# Patient Record
Sex: Female | Born: 1960
Health system: Southern US, Community
[De-identification: ages and names within clinical notes are randomized; demographics above are authoritative.]

## PROBLEM LIST (undated history)

## (undated) DIAGNOSIS — L4050 Arthropathic psoriasis, unspecified: Secondary | ICD-10-CM

## (undated) DIAGNOSIS — L80 Vitiligo: Secondary | ICD-10-CM

## (undated) DIAGNOSIS — M069 Rheumatoid arthritis, unspecified: Secondary | ICD-10-CM

## (undated) DIAGNOSIS — N939 Abnormal uterine and vaginal bleeding, unspecified: Secondary | ICD-10-CM

## (undated) DIAGNOSIS — D219 Benign neoplasm of connective and other soft tissue, unspecified: Secondary | ICD-10-CM

## (undated) HISTORY — DX: Arthropathic psoriasis, unspecified: L40.50

## (undated) HISTORY — PX: NO PAST SURGERIES: SHX2092

## (undated) HISTORY — DX: Abnormal uterine and vaginal bleeding, unspecified: N93.9

## (undated) HISTORY — DX: Benign neoplasm of connective and other soft tissue, unspecified: D21.9

---

## 2012-05-22 ENCOUNTER — Ambulatory Visit (INDEPENDENT_AMBULATORY_CARE_PROVIDER_SITE_OTHER): Payer: Managed Care, Other (non HMO) | Admitting: Emergency Medicine

## 2012-05-22 VITALS — BP 120/77 | HR 72 | Temp 98.3°F | Resp 17 | Ht 68.5 in | Wt 175.0 lb

## 2012-05-22 DIAGNOSIS — B86 Scabies: Secondary | ICD-10-CM

## 2012-05-22 MED ORDER — IVERMECTIN 3 MG PO TABS: ORAL_TABLET | ORAL | Status: DC

## 2012-05-22 NOTE — Progress Notes (Signed)
  Subjective:    Patient ID: Brittney Gallegos, female    DOB: 31-Oct-1960, 51 y.o.   MRN: [id number]  HPI patient's ex-husband was in jail when he contracted scabies. This was in April of this year. Patient has been treated with permethrin topical as well as ivermectin. She continues to have intermittent outbreaks of rash or Marilee and bothering her abdomen and hands. She has mixed her own permethrin from the vet supply store.    Review of Systems     Objective:   Physical Exam bite-like areas across lower abdomen and isolated areas on the hand.        Assessment & Plan:  These areas could definitely be scabietic. We'll treat with ivermectin 18 mg repeat in 2 weeks if necessary.

## 2012-05-22 NOTE — Patient Instructions (Signed)
Scabies Scabies are small bugs (mites) that burrow under the skin and cause red bumps and severe itching. These bugs can only be seen with a microscope. Scabies are highly contagious. They can spread easily from person to person by direct contact. They are also spread through sharing clothing or linens that have the scabies mites living in them. It is not unusual for an entire family to become infected through shared towels, clothing, or bedding.  HOME CARE INSTRUCTIONS   Your caregiver may prescribe a cream or lotion to kill the mites. If cream is prescribed, massage the cream into the entire body from the neck to the bottom of both feet. Also massage the cream into the scalp and face if your child is less than 1 year old. Avoid the eyes and mouth. Do not wash your hands after application.  Leave the cream on for 8 to 12 hours. Your child should bathe or shower after the 8 to 12 hour application period. Sometimes it is helpful to apply the cream to your child right before bedtime.  One treatment is usually effective and will eliminate approximately 95% of infestations. For severe cases, your caregiver may decide to repeat the treatment in 1 week. Everyone in your household should be treated with one application of the cream.  New rashes or burrows should not appear within 24 to 48 hours after successful treatment. However, the itching and rash may last for 2 to 4 weeks after successful treatment. Your caregiver may prescribe a medicine to help with the itching or to help the rash go away more quickly.  Scabies can live on clothing or linens for up to 3 days. All of your child's recently used clothing, towels, stuffed toys, and bed linens should be washed in hot water and then dried in a dryer for at least 20 minutes on high heat. Items that cannot be washed should be enclosed in a plastic bag for at least 3 days.  To help relieve itching, bathe your child in a cool bath or apply cool washcloths to the  affected areas.  Your child may return to school after treatment with the prescribed cream. SEEK MEDICAL CARE IF:   The itching persists longer than 4 weeks after treatment.  The rash spreads or becomes infected. Signs of infection include red blisters or yellow-tan crust. Document Released: 07/15/2005 Document Revised: 10/07/2011 Document Reviewed: 11/23/2008 ExitCare Patient Information 2013 [employer].  

## 2012-06-22 ENCOUNTER — Other Ambulatory Visit: Payer: Self-pay | Admitting: Emergency Medicine

## 2012-06-22 MED ORDER — PERMETHRIN 0.25 % LIQD: 1.0000 "application " | Freq: Once | Status: DC

## 2012-06-22 NOTE — Telephone Encounter (Signed)
i sent a refill for both

## 2012-11-26 ENCOUNTER — Encounter: Payer: Self-pay | Admitting: Family Medicine

## 2012-11-26 ENCOUNTER — Ambulatory Visit (INDEPENDENT_AMBULATORY_CARE_PROVIDER_SITE_OTHER): Payer: PRIVATE HEALTH INSURANCE | Admitting: Family Medicine

## 2012-11-26 VITALS — BP 130/80 | HR 72 | Temp 97.9°F | Resp 12 | Ht 68.0 in | Wt 173.0 lb

## 2012-11-26 DIAGNOSIS — Z780 Asymptomatic menopausal state: Secondary | ICD-10-CM

## 2012-11-26 DIAGNOSIS — L4050 Arthropathic psoriasis, unspecified: Secondary | ICD-10-CM

## 2012-11-26 DIAGNOSIS — Z86018 Personal history of other benign neoplasm: Secondary | ICD-10-CM | POA: Insufficient documentation

## 2012-11-26 DIAGNOSIS — Z8742 Personal history of other diseases of the female genital tract: Secondary | ICD-10-CM

## 2012-11-26 NOTE — Progress Notes (Signed)
  Subjective:    Patient ID: Brittney Gallegos, female    DOB: [birth year], 52 y.o.   MRN: [id number]  HPI Patient seen to establish care. She has history of psoriatic arthritis followed by rheumatologist at [facility]. She takes plaquenil 200 mg once daily and is getting regular eye care. History of reported uterine fibroids. No menstrual period for about 13 months. No prior surgeries. History of some recurrent scabies and ongoing issues with that.  Family history is reviewed and as indicated below. Her mom is also had history of temporal arteritis.  Patient is divorced. No children. Nonsmoker. Only occasional alcohol use.   Review of Systems  Constitutional: Negative for fatigue.  Eyes: Negative for visual disturbance.  Respiratory: Negative for cough, chest tightness, shortness of breath and wheezing.   Cardiovascular: Negative for chest pain, palpitations and leg swelling.  Skin: Positive for rash.  Neurological: Negative for dizziness, seizures, syncope, weakness, light-headedness and headaches.       Objective:   Physical Exam  Constitutional: She appears well-developed and well-nourished.  Neck: Neck supple. No thyromegaly present.  Cardiovascular: Normal rate and regular rhythm.   No murmur heard. Pulmonary/Chest: Effort normal and breath sounds normal. No respiratory distress. She has no wheezes. She has no rales.  Musculoskeletal: She exhibits no edema.          Assessment & Plan:  #1 history of psoriatic arthritis followed by rheumatology #2 postmenopausal. Minimal hot flushes #3 history of uterine fibroids which are asymptomatic #4 health maintenance. She is encouraged to schedule complete physical but she has very high deductible and is reluctant at this time. She's never had screening colonoscopy

## 2012-11-26 NOTE — Patient Instructions (Addendum)
Consider complete physical at some point later this year. 

## 2013-07-15 ENCOUNTER — Ambulatory Visit (INDEPENDENT_AMBULATORY_CARE_PROVIDER_SITE_OTHER): Payer: PRIVATE HEALTH INSURANCE | Admitting: Family Medicine

## 2013-07-15 ENCOUNTER — Encounter: Payer: Self-pay | Admitting: Family Medicine

## 2013-07-15 VITALS — BP 116/72 | HR 75 | Temp 97.7°F | Wt 178.0 lb

## 2013-07-15 DIAGNOSIS — J019 Acute sinusitis, unspecified: Secondary | ICD-10-CM

## 2013-07-15 MED ORDER — AMOXICILLIN-POT CLAVULANATE 875-125 MG PO TABS: 1.0000 | ORAL_TABLET | Freq: Two times a day (BID) | ORAL | Status: DC

## 2013-07-15 NOTE — Patient Instructions (Signed)

## 2013-07-15 NOTE — Progress Notes (Signed)
Pre visit review using our clinic review tool, if applicable. No additional management support is needed unless otherwise documented below in the visit note. 

## 2013-07-15 NOTE — Progress Notes (Signed)
   Subjective:    Patient ID: Brittney Gallegos, female    DOB: [birth year], 52 y.o.   MRN: [id number]  HPI Acute visit Patient had onset about 3 weeks ago of sore throat, rhinorrhea and cough along with sinus congestion facial pain. No headaches. She has some colored nasal discharge. She was visiting her sister and took some Keflex and she took that for several days. She felt better on the Keflex and a few days after stopping had relapse of symptoms. She's not any fever. She's had reported history of previous nasal polyp and takes Nasonex somewhat intermittently. Has tried over-the-counter medications without much improvement  No past medical history on file. No past surgical history on file.  reports that she has never smoked. She does not have any smokeless tobacco history on file. Her alcohol and drug histories are not on file. family history is not on file. No Known Allergies   Review of Systems  Constitutional: Positive for fatigue. Negative for fever and chills.  HENT: Positive for congestion and sinus pressure.   Respiratory: Positive for cough.   Neurological: Negative for headaches.       Objective:   Physical Exam  Constitutional: She appears well-developed and well-nourished.  HENT:  Right Ear: External ear normal.  Left Ear: External ear normal.  Mouth/Throat: Oropharynx is clear and moist.  Neck: Neck supple. No thyromegaly present.  Cardiovascular: Normal rate.   Pulmonary/Chest: Effort normal and breath sounds normal. No respiratory distress. She has no wheezes. She has no rales.          Assessment & Plan:   acute sinusitis. Augmentin 875 mg twice daily for 10 days. Recommend probiotic. Increase fluids. Followup if symptoms persist or worsen

## 2013-08-11 ENCOUNTER — Other Ambulatory Visit: Payer: Self-pay | Admitting: Family Medicine

## 2013-08-11 NOTE — Telephone Encounter (Signed)
Last visit 07/15/13 Last refill 07/15/13 #20 0

## 2013-08-11 NOTE — Telephone Encounter (Signed)
Refill once 

## 2014-03-04 ENCOUNTER — Ambulatory Visit (INDEPENDENT_AMBULATORY_CARE_PROVIDER_SITE_OTHER): Payer: Managed Care, Other (non HMO) | Admitting: Certified Nurse Midwife

## 2014-03-04 ENCOUNTER — Encounter: Payer: Self-pay | Admitting: Certified Nurse Midwife

## 2014-03-04 VITALS — BP 110/64 | HR 68 | Resp 16 | Ht 68.5 in | Wt 173.0 lb

## 2014-03-04 DIAGNOSIS — Z124 Encounter for screening for malignant neoplasm of cervix: Secondary | ICD-10-CM

## 2014-03-04 DIAGNOSIS — N951 Menopausal and female climacteric states: Secondary | ICD-10-CM

## 2014-03-04 DIAGNOSIS — Z0000 Encounter for general adult medical examination without abnormal findings: Secondary | ICD-10-CM

## 2014-03-04 DIAGNOSIS — N912 Amenorrhea, unspecified: Secondary | ICD-10-CM

## 2014-03-04 DIAGNOSIS — Z01419 Encounter for gynecological examination (general) (routine) without abnormal findings: Secondary | ICD-10-CM

## 2014-03-04 LAB — POCT URINALYSIS DIPSTICK
BILIRUBIN UA: NEGATIVE
GLUCOSE UA: NEGATIVE
Ketones, UA: NEGATIVE
Leukocytes, UA: NEGATIVE
NITRITE UA: NEGATIVE
Protein, UA: NEGATIVE
RBC UA: NEGATIVE
UROBILINOGEN UA: NEGATIVE
pH, UA: 5

## 2014-03-04 NOTE — Progress Notes (Signed)
53 y.o. G0P0000 Divorced Caucasian Fe here to establish gyn care and annual exam. Menopausal ? for the past two 1/2 years, no labs done to confirm , just stopped periods. Previous history of fibroids with bleeding and stopped period so no other problems until bleeding occurred 02/04/14 which lasted" a couple of days". Patient has Psoas arthritis with Dr. Lynwood Dawley management. Sees PCP Dr. Elease Hashimoto as needed. Moved here recently from [place]., so care was done there. Occasional hot flashes, no night sweats. Dr. Alroy Dust does her labs. No other health issues today.   Patient's last menstrual period was 02/04/2014.        Sexually active: Yes.    The current method of family planning is none.    Exercising: Yes.    exercise Smoker:  no  Health Maintenance: Pap:  2014 negative, no abnormals MMG:  2015 or 2014? negative Colonoscopy:  none BMD:   3 yrs ago normal per patient TDaP:  Unsure, but UTD Labs: Poct urine-neg Self breast exam: done monthly   reports that she has never smoked. She does not have any smokeless tobacco history on file. She reports that she drinks about 1.5 ounces of alcohol per week. She reports that she does not use illicit drugs.  Past Medical History  Diagnosis Date  . Abnormal uterine bleeding   . Fibroid   . Psoriatic arthritis     History reviewed. No pertinent past surgical history.  Current Outpatient Prescriptions  Medication Sig Dispense Refill  . aspirin EC 81 MG tablet Take by mouth daily.      . B Complex Vitamins (VITAMIN B COMPLEX PO) Take by mouth daily.      Marland Kitchen CALCIUM PO Take by mouth daily.      . cholecalciferol (VITAMIN D) 1000 UNITS tablet Take 1,000 Units by mouth daily.      . hydroxychloroquine (PLAQUENIL) 200 MG tablet Take 200 mg by mouth daily.       Marland Kitchen MAGNESIUM PO Take by mouth. +zinc      . Nutritional Supplements (JUICE PLUS FIBRE PO) Take by mouth daily.      . Omega-3 Fatty Acids (FISH OIL) 1000 MG CAPS Take by mouth daily.        No current facility-administered medications for this visit.    Family History  Problem Relation Age of Onset  . Hypertension Mother   . Heart disease Mother   . Cancer Father     bladder  . Hypertension Father   . Heart disease Father   . Breast cancer Sister     ROS:  Pertinent items are noted in HPI.  Otherwise, a comprehensive ROS was negative.  Exam:   BP 110/64  Pulse 68  Resp 16  Ht 5' 8.5" (1.74 m)  Wt 173 lb (78.472 kg)  BMI 25.92 kg/m2  LMP 02/04/2014 Height: 5' 8.5" (174 cm)  Ht Readings from Last 3 Encounters:  03/04/14 5' 8.5" (1.74 m)  11/26/12 5\' 8"  (1.727 m)  05/22/12 5' 8.5" (1.74 m)    General appearance: alert, cooperative and appears stated age Head: Normocephalic, without obvious abnormality, atraumatic Neck: no adenopathy, supple, symmetrical, trachea midline and thyroid normal to inspection and palpation and non-palpable Lungs: clear to auscultation bilaterally Breasts: normal appearance, no masses or tenderness, No nipple retraction or dimpling, No nipple discharge or bleeding, No axillary or supraclavicular adenopathy Heart: regular rate and rhythm Abdomen: soft, non-tender; no masses,  no organomegaly Extremities: extremities normal, atraumatic, no cyanosis or edema  Skin: Skin color, texture, turgor normal. No rashes or lesions Lymph nodes: Cervical, supraclavicular, and axillary nodes normal. No abnormal inguinal nodes palpated Neurologic: Grossly normal   Pelvic: External genitalia:  no lesions              Urethra:  normal appearing urethra with no masses, tenderness or lesions              Bartholin's and Skene's: normal                 Vagina: normal appearing vagina with normal color and discharge, no lesions              Cervix: normal, no lesions              Pap taken: Yes.   Bimanual Exam:  Uterus:  enlarged, 8 weeks size and slighty firm              Adnexa: normal adnexa and no mass, fullness, tenderness                Rectovaginal: Confirms               Anus:  normal sphincter tone, no lesions  A:  Well Woman with normal exam  Menopausal? No period in 2 1/2 years  Post menopausal bleeding  History of fibroids with enlarged uterus  Psoas Arthritis under MD care.  P:   Reviewed health and wellness pertinent to exam  Discussed if menopausal or not no bleeding for long period of time and then bleeding is a concern and needs to be evaluated.Discussed Labs to assess no period for other problems such as thyroid or pituitary issues and menopause. Patient agreeable. Also discussed need for evaluation of bleeding with PUS, Endometrial biopsy and possible SHGM. Discussed fibroids will also be evaluated at that time and can cause bleeding. Patient agreeable to all need as above for evaluation. Patient will contacted by insurance personnel here with information and will be scheduled for evaluation. Patient voiced understanding. Instructed patient to notify if any more bleeding for evaluation.  Lab: FSH,TSH,Prolactin  Continue follow up with MD as needed  Pap smear taken today with HPVHR  counseled on breast self exam, mammography screening, colonoscopy risk and benefits, declines scheduling or IFOB will work Dr. Elease Hashimoto on STD prevention, menopause, adequate intake of calcium and vitamin D, diet and exercise return annually or prn  An After Visit Summary was printed and given to the patient.

## 2014-03-04 NOTE — Patient Instructions (Signed)

## 2014-03-05 LAB — TSH: TSH: 1.426 u[IU]/mL (ref 0.350–4.500)

## 2014-03-05 LAB — PROLACTIN: PROLACTIN: 6 ng/mL

## 2014-03-05 LAB — FOLLICLE STIMULATING HORMONE: FSH: 89.5 m[IU]/mL

## 2014-03-07 ENCOUNTER — Other Ambulatory Visit: Payer: Self-pay | Admitting: Obstetrics & Gynecology

## 2014-03-07 DIAGNOSIS — N950 Postmenopausal bleeding: Secondary | ICD-10-CM

## 2014-03-07 NOTE — Progress Notes (Signed)
[place] in clearly menopausal range so agree with evaluation.  I placed orders placed for SHGM/endometrial biopsy.  Reviewed personally.  Felipa Emory, MD.

## 2014-03-08 LAB — IPS PAP TEST WITH HPV

## 2014-03-10 ENCOUNTER — Telehealth: Payer: Self-pay | Admitting: Certified Nurse Midwife

## 2014-03-10 NOTE — Telephone Encounter (Signed)
Patient calling to schedule a PUS/SHGM. She states she was "worried we forgot about her." I reassured the patient we will get back with her as quickly as we can. She was last seen 03/04/14.

## 2014-03-10 NOTE — Telephone Encounter (Signed)
Left message to call [place] at [phone].  Patient needs to be scheduled for [facility] with [facility]. Needs to be aware of OOP cost of 780.68.

## 2014-03-11 NOTE — Telephone Encounter (Signed)
Spoke with patient. Patient states that she is only able to make appointment for Friday. Advised patient that we do not have ultrasound in the office on Friday. Patient requesting a late afternoon appointment when we have ultrasound. Appointment scheduled for [facility] with E[facility] on 8/25 at 4pm with 4:30pm consult with Dr.Lathrop (okay per provider). Pelvic U/S scheduled and patient aware/agreeable to time.  Patient verbalized understanding of the U/S appointment cancellation policy. Advised will need to cancel within 72 business hours (3 business days) or will have $100.00 no show fee placed to account. $150.00 for [facility]. Patient may want to have done at the hospital and will call back if she would like to do this instead.  Routing to Dr.Lathrop Cc: Regina Eck CNM   Routing to provider for final review. Patient agreeable to disposition. Will close encounter

## 2014-03-11 NOTE — Telephone Encounter (Signed)
Spoke with patient. She states that she needs a five digit code to tell the lady at the hospital to check pricing. Patient states that they mentioned anaesthesia. Advised patient that that is not necessary for a SHGM with EMB. Patient agreeable. Spoke with Gabriel Cirri about five digit code for patient. Advised that patient will have to meet deductible before hand regardless but that she may not have to may up front at the hospital. Advised patient of this and she states that she will just keep appointment with [place] for 8/25 at 4pm.  Routing to Dr.Lathrop Cc: Regina Eck CNM   Encounter closed previously.

## 2014-03-11 NOTE — Telephone Encounter (Signed)
Spoke with patient. Advised of OOP cost for [facility] and EMB. Patient would like to know more about cost and coverage and would like to speak to billing. Advised billing is currently on lunch but will return shortly and that I will send a message to have them give her a call to discuss her benefits and cost. Patient is agreeable.   Routing to [employer]

## 2014-03-11 NOTE — Telephone Encounter (Signed)
Spoke with patient. Advised that per the benefit quote received, she will be responsible for $780.68 when she comes in for SHGM/EMB. Explained: Copay: $50 ([id number]) Deductible: $750  (27.00 met)  ([id number]) OOP:  Coins: 80/20 PAC: n/a Ref# [id number] Allowed amount: $1159.80   PR: $50+$730.68= [place] $780.68  Patient was agreeable and stated that she would reach out to [facility] either later today or early next week.

## 2014-03-21 ENCOUNTER — Telehealth: Payer: Self-pay | Admitting: Gynecology

## 2014-03-21 NOTE — Telephone Encounter (Signed)
Patient has an endo biopsy appointment scheduled tomorrow. Patient says she must go out of town tomorrow and is asking to reschedule to another late afternoon. I did not cancel original appointment.

## 2014-03-21 NOTE — Telephone Encounter (Signed)
Spoke with patient. She states that she is leaving town today and will not be able to make [facility], Endo BX appt tomorrow. Rescheduled appt for 04/12/2014 @ 1600. Advised patient of 72 hour cancellation policy and $098 cancellation fee. Patient agreeable.  Patient questioned whether or not she would be charged the cancellation fee for the appt tomorrow. I advised that she would. Patient would like a call from [place] today advising whether or not the fee can be waived since she "could not call over the weekend".

## 2014-03-21 NOTE — Telephone Encounter (Signed)
Routing to Dr.Lathrop Cc: Regina Eck CNM   Routing to provider for final review. Patient agreeable to disposition. Will close encounter

## 2014-03-21 NOTE — Telephone Encounter (Signed)
Patient called back. States that she doesn't want to risk taking on the cancellation fee and would like to keep original appointment for SHGM/EMB 08.25.2015 @ 1600.

## 2014-03-22 ENCOUNTER — Other Ambulatory Visit: Payer: Managed Care, Other (non HMO) | Admitting: Gynecology

## 2014-03-22 ENCOUNTER — Ambulatory Visit (INDEPENDENT_AMBULATORY_CARE_PROVIDER_SITE_OTHER): Payer: Managed Care, Other (non HMO) | Admitting: Gynecology

## 2014-03-22 ENCOUNTER — Other Ambulatory Visit: Payer: Managed Care, Other (non HMO)

## 2014-03-22 ENCOUNTER — Other Ambulatory Visit: Payer: Self-pay | Admitting: *Deleted

## 2014-03-22 ENCOUNTER — Encounter: Payer: Self-pay | Admitting: Gynecology

## 2014-03-22 ENCOUNTER — Other Ambulatory Visit: Payer: Self-pay | Admitting: Gynecology

## 2014-03-22 ENCOUNTER — Ambulatory Visit (INDEPENDENT_AMBULATORY_CARE_PROVIDER_SITE_OTHER): Payer: Managed Care, Other (non HMO)

## 2014-03-22 VITALS — Ht 68.5 in

## 2014-03-22 DIAGNOSIS — N852 Hypertrophy of uterus: Secondary | ICD-10-CM

## 2014-03-22 DIAGNOSIS — N950 Postmenopausal bleeding: Secondary | ICD-10-CM

## 2014-03-22 DIAGNOSIS — D259 Leiomyoma of uterus, unspecified: Secondary | ICD-10-CM

## 2014-03-22 DIAGNOSIS — D250 Submucous leiomyoma of uterus: Secondary | ICD-10-CM

## 2014-03-22 MED ORDER — MISOPROSTOL 200 MCG PO TABS: ORAL_TABLET | ORAL | Status: DC

## 2014-03-22 NOTE — Progress Notes (Signed)
Pt here for PUS/SHG for PMB.  Pt with known uterine fibroids. Images reviewed with pt- Uterus notable for 10 fibroids, largest 3cm, 1 submucosal noted with feeder vessel.  EMS difficult to define, appears thin. ovaries are atrophic appearing. Recommend proceeding to [facility], consent obtained. Speculum placed, cervix cleansed with betadine, insemination catheter placed and walls gently distended with NS.  Anterior wall mass noted almost entirely intracavitary, measuring 1.7cm  Findings reviewed, recommend hysteroscopy to remove. Procedure outlined to pt, operative photos reviewed and explained. Risks of bleeding, infection, uterine perforation discussed.  Possible need for laparoscopy/-otomy for repair after perforation.  Risks of DVT/PE formation post-op, PAS will be placed.  Anesthetic risks discussed, as well as fluid overload.Operative precautions reviewed. Nullipara-cytotec priming of cervix recommended.  Past Medical History  Diagnosis Date  . Abnormal uterine bleeding   . Fibroid   . Psoriatic arthritis    History reviewed. No pertinent past surgical history.  No Known Allergies  Ht 5' 8.5" (1.74 m)  LMP 02/04/2014 General appearance: alert, cooperative and appears stated age Lungs: clear to auscultation bilaterally Heart: regular rate and rhythm, S1, S2 normal, no murmur, click, rub or gallop Abdomen: soft, non-tender; bowel sounds normal; no masses,  no organomegaly Pelvic: cervix normal in appearance, external genitalia normal, no adnexal masses or tenderness, no cervical motion tenderness, vagina normal without discharge and utuerus enlarged and irregular Extremities: extremities normal, atraumatic, no cyanosis or edema  Questions addressed 14m spent counseling,  >50% face to face

## 2014-03-22 NOTE — Patient Instructions (Signed)
Hysteroscopy °Hysteroscopy is a procedure used for looking inside the womb (uterus). It may be done for various reasons, including: °· To evaluate abnormal bleeding, fibroid (benign, noncancerous) tumors, polyps, scar tissue (adhesions), and possibly cancer of the uterus. °· To look for lumps (tumors) and other uterine growths. °· To look for causes of why a woman cannot get pregnant (infertility), causes of recurrent loss of pregnancy (miscarriages), or a lost intrauterine device (IUD). °· To perform a sterilization by blocking the fallopian tubes from inside the uterus. °In this procedure, a thin, flexible tube with a tiny light and camera on the end of it (hysteroscope) is used to look inside the uterus. A hysteroscopy should be done right after a menstrual period to be sure you are not pregnant. °LET YOUR HEALTH CARE PROVIDER KNOW ABOUT:  °· Any allergies you have. °· All medicines you are taking, including vitamins, herbs, eye drops, creams, and over-the-counter medicines. °· Previous problems you or members of your family have had with the use of anesthetics. °· Any blood disorders you have. °· Previous surgeries you have had. °· Medical conditions you have. °RISKS AND COMPLICATIONS  °Generally, this is a safe procedure. However, as with any procedure, complications can occur. Possible complications include: °· Putting a hole in the uterus. °· Excessive bleeding. °· Infection. °· Damage to the cervix. °· Injury to other organs. °· Allergic reaction to medicines. °· Too much fluid used in the uterus for the procedure. °BEFORE THE PROCEDURE  °· Ask your health care provider about changing or stopping any regular medicines. °· Do not take aspirin or blood thinners for 1 week before the procedure, or as directed by your health care provider. These can cause bleeding. °· If you smoke, do not smoke for 2 weeks before the procedure. °· In some cases, a medicine is placed in the cervix the day before the procedure.  This medicine makes the cervix have a larger opening (dilate). This makes it easier for the instrument to be inserted into the uterus during the procedure. °· Do not eat or drink anything for at least 8 hours before the surgery. °· Arrange for someone to take you home after the procedure. °PROCEDURE  °· You may be given a medicine to relax you (sedative). You may also be given one of the following: °¨ A medicine that numbs the area around the cervix (local anesthetic). °¨ A medicine that makes you sleep through the procedure (general anesthetic). °· The hysteroscope is inserted through the vagina into the uterus. The camera on the hysteroscope sends a picture to a TV screen. This gives the surgeon a good view inside the uterus. °· During the procedure, air or a liquid is put into the uterus, which allows the surgeon to see better. °· Sometimes, tissue is gently scraped from inside the uterus. These tissue samples are sent to a lab for testing. °AFTER THE PROCEDURE  °· If you had a general anesthetic, you may be groggy for a couple hours after the procedure. °· If you had a local anesthetic, you will be able to go home as soon as you are stable and feel ready. °· You may have some cramping. This normally lasts for a couple days. °· You may have bleeding, which varies from light spotting for a few days to menstrual-like bleeding for 3-7 days. This is normal. °· If your test results are not back during the visit, make an appointment with your health care provider to find out the   results. Document Released: 10/21/2000 Document Revised: 05/05/2013 Document Reviewed: 02/11/2013 ExitCare Patient Information 2015 [employer]. This information is not intended to replace advice given to you by your health care provider. Make sure you discuss any questions you have with your health care provider.   

## 2014-03-31 ENCOUNTER — Telehealth: Payer: Self-pay | Admitting: Gynecology

## 2014-03-31 NOTE — Telephone Encounter (Signed)
Spoke with patient. Advised that per benefit quote received, she will be responsible for $86.53 for the surgeons portion of her surgery. Advised that per our office policy, this payment will be due in full at least 2 weeks prior to her scheduled surgery date. Patient agreeable. Patient prefers that surgery be performed on a Friday or even a Thursday afternoon. I advised that this is out of the norm, but that the decision would be made by the dr and the scheduling nurse. Patient agreeable.

## 2014-04-08 ENCOUNTER — Telehealth: Payer: Self-pay | Admitting: Gynecology

## 2014-04-08 NOTE — Telephone Encounter (Signed)
Patient returned call. Paid surgeons fee of $86.53 over the phone. Mailed patient a copy of receipt

## 2014-04-08 NOTE — Telephone Encounter (Signed)
Left message for patient to call back. Need to discuss surgery payment °

## 2014-04-08 NOTE — Telephone Encounter (Signed)
Call to patietn. Advised surgey scheduled for Friday 04-15-14 at 1145. Instruction sheet reviewed and mailed. Past op appointment scheduled. Call prn.   Routing to provider for final review. Patient agreeable to disposition. Will close encounter

## 2014-04-08 NOTE — Telephone Encounter (Signed)
Call to patient to discuss dates. Previous message indicates patient prefers Friday case. She is agreeable to 04-15-14. Will proceed with scheduling and call her back.

## 2014-04-11 ENCOUNTER — Encounter (HOSPITAL_COMMUNITY): Payer: Self-pay | Admitting: Pharmacist

## 2014-04-11 ENCOUNTER — Encounter (HOSPITAL_COMMUNITY): Payer: Self-pay | Admitting: *Deleted

## 2014-04-12 ENCOUNTER — Other Ambulatory Visit: Payer: Managed Care, Other (non HMO) | Admitting: Gynecology

## 2014-04-12 ENCOUNTER — Other Ambulatory Visit: Payer: Managed Care, Other (non HMO)

## 2014-04-15 ENCOUNTER — Encounter (HOSPITAL_COMMUNITY)
Admission: RE | Disposition: A | Discharge status: Home / Self Care | Payer: Self-pay | Source: Ambulatory Visit | Attending: Gynecology

## 2014-04-15 ENCOUNTER — Ambulatory Visit (HOSPITAL_COMMUNITY)
Admission: RE | Admit: 2014-04-15 | Discharge: 2014-04-15 | Disposition: A | Discharge status: Home / Self Care | Payer: Managed Care, Other (non HMO) | Source: Ambulatory Visit | Attending: Gynecology | Admitting: Gynecology

## 2014-04-15 ENCOUNTER — Ambulatory Visit (HOSPITAL_COMMUNITY): Payer: Managed Care, Other (non HMO) | Admitting: Anesthesiology

## 2014-04-15 ENCOUNTER — Encounter (HOSPITAL_COMMUNITY): Payer: Managed Care, Other (non HMO) | Admitting: Anesthesiology

## 2014-04-15 ENCOUNTER — Encounter (HOSPITAL_COMMUNITY): Payer: Self-pay | Admitting: Anesthesiology

## 2014-04-15 DIAGNOSIS — L4050 Arthropathic psoriasis, unspecified: Secondary | ICD-10-CM | POA: Insufficient documentation

## 2014-04-15 DIAGNOSIS — D259 Leiomyoma of uterus, unspecified: Secondary | ICD-10-CM

## 2014-04-15 DIAGNOSIS — Z538 Procedure and treatment not carried out for other reasons: Secondary | ICD-10-CM | POA: Diagnosis not present

## 2014-04-15 DIAGNOSIS — N950 Postmenopausal bleeding: Secondary | ICD-10-CM | POA: Insufficient documentation

## 2014-04-15 DIAGNOSIS — D250 Submucous leiomyoma of uterus: Secondary | ICD-10-CM | POA: Diagnosis not present

## 2014-04-15 DIAGNOSIS — Z9889 Other specified postprocedural states: Secondary | ICD-10-CM

## 2014-04-15 HISTORY — PX: DILATATION & CURETTAGE/HYSTEROSCOPY WITH TRUECLEAR: SHX6353

## 2014-04-15 LAB — CBC
HEMATOCRIT: 40.5 % (ref 36.0–46.0)
HEMOGLOBIN: 13.5 g/dL (ref 12.0–15.0)
MCH: 32.3 pg (ref 26.0–34.0)
MCHC: 33.3 g/dL (ref 30.0–36.0)
MCV: 96.9 fL (ref 78.0–100.0)
Platelets: 114 10*3/uL — ABNORMAL LOW (ref 150–400)
RBC: 4.18 MIL/uL (ref 3.87–5.11)
RDW: 12.2 % (ref 11.5–15.5)
WBC: 3.1 10*3/uL — ABNORMAL LOW (ref 4.0–10.5)

## 2014-04-15 SURGERY — DILATATION & CURETTAGE/HYSTEROSCOPY WITH TRUCLEAR
Anesthesia: General

## 2014-04-15 MED ORDER — KETOROLAC TROMETHAMINE 30 MG/ML IJ SOLN
INTRAMUSCULAR | Status: AC
  Filled 2014-04-15: qty 1

## 2014-04-15 MED ORDER — MIDAZOLAM HCL 2 MG/2ML IJ SOLN
INTRAMUSCULAR | Status: DC | PRN
  Administered 2014-04-15: 2 mg via INTRAVENOUS

## 2014-04-15 MED ORDER — FENTANYL CITRATE 0.05 MG/ML IJ SOLN
INTRAMUSCULAR | Status: AC
  Filled 2014-04-15: qty 5

## 2014-04-15 MED ORDER — FENTANYL CITRATE 0.05 MG/ML IJ SOLN
INTRAMUSCULAR | Status: DC | PRN
  Administered 2014-04-15 (×3): 25 ug via INTRAVENOUS
  Administered 2014-04-15: 50 ug via INTRAVENOUS
  Administered 2014-04-15: 25 ug via INTRAVENOUS
  Administered 2014-04-15: 100 ug via INTRAVENOUS

## 2014-04-15 MED ORDER — BUPIVACAINE HCL 0.25 % IJ SOLN
INTRAMUSCULAR | Status: DC | PRN
  Administered 2014-04-15: 14 mL

## 2014-04-15 MED ORDER — MEPERIDINE HCL 25 MG/ML IJ SOLN: 6.2500 mg | INTRAMUSCULAR | Status: DC | PRN

## 2014-04-15 MED ORDER — KETOROLAC TROMETHAMINE 30 MG/ML IJ SOLN
INTRAMUSCULAR | Status: DC | PRN
  Administered 2014-04-15: 30 mg via INTRAVENOUS

## 2014-04-15 MED ORDER — LIDOCAINE-EPINEPHRINE 1 %-1:100000 IJ SOLN
INTRAMUSCULAR | Status: DC | PRN
  Administered 2014-04-15: 14 mL

## 2014-04-15 MED ORDER — LACTATED RINGERS IV SOLN
INTRAVENOUS | Status: DC
  Administered 2014-04-15 (×2): via INTRAVENOUS

## 2014-04-15 MED ORDER — SCOPOLAMINE 1 MG/3DAYS TD PT72
1.0000 | MEDICATED_PATCH | Freq: Once | TRANSDERMAL | Status: DC
  Administered 2014-04-15: 1.5 mg via TRANSDERMAL

## 2014-04-15 MED ORDER — LIDOCAINE HCL (CARDIAC) 20 MG/ML IV SOLN
INTRAVENOUS | Status: DC | PRN
  Administered 2014-04-15: 80 mg via INTRAVENOUS

## 2014-04-15 MED ORDER — ONDANSETRON HCL 4 MG/2ML IJ SOLN
INTRAMUSCULAR | Status: DC | PRN
Start: 2014-04-15 — End: 2014-04-15
  Administered 2014-04-15: 4 mg via INTRAVENOUS

## 2014-04-15 MED ORDER — PROPOFOL 10 MG/ML IV EMUL
INTRAVENOUS | Status: AC
  Filled 2014-04-15: qty 20

## 2014-04-15 MED ORDER — PROPOFOL INFUSION 10 MG/ML OPTIME
INTRAVENOUS | Status: DC | PRN
  Administered 2014-04-15: 15 mL via INTRAVENOUS

## 2014-04-15 MED ORDER — LIDOCAINE HCL (CARDIAC) 20 MG/ML IV SOLN
INTRAVENOUS | Status: AC
  Filled 2014-04-15: qty 5

## 2014-04-15 MED ORDER — METOCLOPRAMIDE HCL 5 MG/ML IJ SOLN: 10.0000 mg | Freq: Once | INTRAMUSCULAR | Status: DC | PRN

## 2014-04-15 MED ORDER — SCOPOLAMINE 1 MG/3DAYS TD PT72
MEDICATED_PATCH | TRANSDERMAL | Status: AC
  Filled 2014-04-15: qty 1

## 2014-04-15 MED ORDER — MIDAZOLAM HCL 2 MG/2ML IJ SOLN
INTRAMUSCULAR | Status: AC
  Filled 2014-04-15: qty 2

## 2014-04-15 MED ORDER — DEXAMETHASONE SODIUM PHOSPHATE 10 MG/ML IJ SOLN
INTRAMUSCULAR | Status: AC
  Filled 2014-04-15: qty 1

## 2014-04-15 MED ORDER — GLYCOPYRROLATE 0.2 MG/ML IJ SOLN
INTRAMUSCULAR | Status: DC | PRN
  Administered 2014-04-15: 0.1 mg via INTRAVENOUS

## 2014-04-15 MED ORDER — ONDANSETRON HCL 4 MG/2ML IJ SOLN
INTRAMUSCULAR | Status: AC
  Filled 2014-04-15: qty 2

## 2014-04-15 MED ORDER — LIDOCAINE-EPINEPHRINE 1 %-1:100000 IJ SOLN
INTRAMUSCULAR | Status: AC
  Filled 2014-04-15: qty 1

## 2014-04-15 MED ORDER — FENTANYL CITRATE 0.05 MG/ML IJ SOLN: 25.0000 ug | INTRAMUSCULAR | Status: DC | PRN

## 2014-04-15 MED ORDER — DEXAMETHASONE SODIUM PHOSPHATE 4 MG/ML IJ SOLN
INTRAMUSCULAR | Status: DC | PRN
  Administered 2014-04-15: 4 mg via INTRAVENOUS

## 2014-04-15 SURGICAL SUPPLY — 17 items
BLADE INCISOR TRUC PLUS 2.9 (ABLATOR) IMPLANT
CANISTERS HI-FLOW 3000CC (CANNISTER) ×6 IMPLANT
CATH ROBINSON RED A/P 16FR (CATHETERS) ×3 IMPLANT
CONTAINER PREFILL 10% NBF 60ML (FORM) ×6 IMPLANT
DRAPE HYSTEROSCOPY (DRAPE) IMPLANT
GLOVE BIOGEL M 6.5 STRL (GLOVE) ×3 IMPLANT
GLOVE BIOGEL PI IND STRL 6.5 (GLOVE) ×1 IMPLANT
GLOVE BIOGEL PI INDICATOR 6.5 (GLOVE) ×2
GOWN STRL REUS W/TWL LRG LVL3 (GOWN DISPOSABLE) ×6 IMPLANT
INCISOR TRUC PLUS BLADE 2.9 (ABLATOR)
KIT HYSTEROSCOPY TRUCLEAR (ABLATOR) IMPLANT
MORCELLATOR RECIP TRUCLEAR 4.0 (ABLATOR) IMPLANT
PACK VAGINAL MINOR WOMEN LF (CUSTOM PROCEDURE TRAY) ×3 IMPLANT
PAD OB MATERNITY 4.3X12.25 (PERSONAL CARE ITEMS) ×3 IMPLANT
PIPET BIOPSY ENDOMETRIAL 3MM (SUCTIONS) ×3 IMPLANT
TOWEL OR 17X24 6PK STRL BLUE (TOWEL DISPOSABLE) ×6 IMPLANT
WATER STERILE IRR 1000ML POUR (IV SOLUTION) ×3 IMPLANT

## 2014-04-15 NOTE — Anesthesia Procedure Notes (Signed)
Procedure Name: LMA Insertion Date/Time: 04/15/2014 12:06 PM Performed by: Flossie Dibble Pre-anesthesia Checklist: Patient identified, Timeout performed, Emergency Drugs available, Suction available and Patient being monitored Patient Re-evaluated:Patient Re-evaluated prior to inductionOxygen Delivery Method: Circle system utilized Preoxygenation: Pre-oxygenation with 100% oxygen Intubation Type: IV induction LMA: LMA inserted LMA Size: 4.0 Number of attempts: 1 Placement Confirmation: breath sounds checked- equal and bilateral and positive ETCO2 Tube secured with: Tape Dental Injury: Teeth and Oropharynx as per pre-operative assessment

## 2014-04-15 NOTE — Interval H&P Note (Signed)
History and Physical Interval Note:  04/15/2014 11:42 AM  Brittney Gallegos  has presented today for surgery, with the diagnosis of PMB, submucosal fibroid  The various methods of treatment have been discussed with the patient and family. After consideration of risks, benefits and other options for treatment, the patient has consented to  Procedure(s) with comments: [place] (N/A) - follow 1000 case as a surgical intervention .  The patient's history has been reviewed, patient examined, no change in status, stable for surgery.  I have reviewed the patient's chart and labs.  Questions were answered to the patient's satisfaction.     Cordelle Dahmen H

## 2014-04-15 NOTE — Transfer of Care (Signed)
Immediate Anesthesia Transfer of Care Note  Patient: Brittney Gallegos  Procedure(s) Performed: Procedure(s): Hysteroscopy, Attempted DILATATION & CURETTAGE,  with attempted TRUCLEAR (N/A)  Patient Location: PACU  Anesthesia Type:General  Level of Consciousness: awake, alert  and oriented  Airway & Oxygen Therapy: Patient Spontanous Breathing and Patient connected to nasal cannula oxygen  Post-op Assessment: Report given to PACU RN and Post -op Vital signs reviewed and stable  Post vital signs: Reviewed and stable  Complications: No apparent anesthesia complications

## 2014-04-15 NOTE — Interval H&P Note (Signed)
History and Physical Interval Note:  04/15/2014 11:42 AM  Brittney Gallegos  has presented today for surgery, with the diagnosis of PMB, submucosal fibroid  The various methods of treatment have been discussed with the patient and family. After consideration of risks, benefits and other options for treatment, the patient has consented to  Procedure(s) with comments: [name] (N/A) - follow 1000 case as a surgical intervention .  The patient's history has been reviewed, patient examined, no change in status, stable for surgery.  I have reviewed the patient's chart and labs.  Questions were answered to the patient's satisfaction.     Beatriz Settles H

## 2014-04-15 NOTE — Anesthesia Preprocedure Evaluation (Signed)
Anesthesia Evaluation  Patient identified by MRN, date of birth, ID band Patient awake    Reviewed: Allergy & Precautions, H&P , NPO status , Patient's Chart, lab work & pertinent test results  Airway Mallampati: II TM Distance: >3 FB Neck ROM: Full    Dental no notable dental hx. (+) Teeth Intact   Pulmonary neg pulmonary ROS,  breath sounds clear to auscultation  Pulmonary exam normal       Cardiovascular negative cardio ROS  Rhythm:Regular Rate:Normal     Neuro/Psych Anxiety negative neurological ROS     GI/Hepatic negative GI ROS, Neg liver ROS,   Endo/Other  negative endocrine ROS  Renal/GU negative Renal ROS  negative genitourinary   Musculoskeletal  (+) Arthritis -, Rheumatoid disorders,  Psoriatic arthritis   Abdominal   Peds  Hematology negative hematology ROS (+)   Anesthesia Other Findings   Reproductive/Obstetrics PMB Submucosal Fibroid                           Anesthesia Physical Anesthesia Plan  ASA: II  Anesthesia Plan: General   Post-op Pain Management:    Induction: Intravenous  Airway Management Planned: LMA  Additional Equipment:   Intra-op Plan:   Post-operative Plan: Extubation in OR  Informed Consent: I have reviewed the patients History and Physical, chart, labs and discussed the procedure including the risks, benefits and alternatives for the proposed anesthesia with the patient or authorized representative who has indicated his/her understanding and acceptance.   Dental advisory given  Plan Discussed with: CRNA, Anesthesiologist and Surgeon  Anesthesia Plan Comments:         Anesthesia Quick Evaluation

## 2014-04-15 NOTE — Discharge Instructions (Signed)
DISCHARGE INSTRUCTIONS: D&C  The following instructions have been prepared to help you care for yourself upon your return home.  MAY TAKE IBUPROFEN (MOTRIN, ADVIL) OR ALEVE AFTER 7:30 PM FOR PAIN!!!!   Personal hygiene:  Use sanitary pads for vaginal drainage, not tampons.  Shower the day after your procedure.  NO tub baths, pools or Jacuzzis for 2-3 weeks.  Wipe front to back after using the bathroom.  Activity and limitations:  Do NOT drive or operate any equipment for 24 hours. The effects of anesthesia are still present and drowsiness may result.  Do NOT rest in bed all day.  Walking is encouraged.  Walk up and down stairs slowly.  You may resume your normal activity in one to two days or as indicated by your physician.  Sexual activity: NO intercourse for at least 2 weeks after the procedure, or as indicated by your physician.  Diet: Eat a light meal as desired this evening. You may resume your usual diet tomorrow.  Return to work: You may resume your work activities in one to two days or as indicated by your doctor.  What to expect after your surgery: Expect to have vaginal bleeding/discharge for 2-3 days and spotting for up to 10 days. It is not unusual to have soreness for up to 1-2 weeks. You may have a slight burning sensation when you urinate for the first day. Mild cramps may continue for a couple of days. You may have a regular period in 2-6 weeks.  Call your doctor for any of the following:  Excessive vaginal bleeding, saturating and changing one pad every hour.  Inability to urinate 6 hours after discharge from hospital.  Pain not relieved by pain medication.  Fever of 100.4 F or greater.  Unusual vaginal discharge or odor.   Call for an appointment:    Patients signature: ______________________  Nurses signature ________________________  Support person's signature_______________________

## 2014-04-15 NOTE — Anesthesia Postprocedure Evaluation (Signed)
  Anesthesia Post-op Note  Patient: Brittney Gallegos  Procedure(s) Performed: Procedure(s): Hysteroscopy, Attempted DILATATION & CURETTAGE,  with attempted TRUCLEAR (N/A)  Patient Location: PACU  Anesthesia Type:General  Level of Consciousness: awake, alert  and oriented  Airway and Oxygen Therapy: Patient Spontanous Breathing  Post-op Pain: mild  Post-op Assessment: Post-op Vital signs reviewed, Patient's Cardiovascular Status Stable, Respiratory Function Stable, Patent Airway, No signs of Nausea or vomiting and Pain level controlled  Post-op Vital Signs: Reviewed and stable  Last Vitals:  Filed Vitals:   04/15/14 1415  BP: 137/72  Pulse: 57  Temp:   Resp: 13    Complications: No apparent anesthesia complications

## 2014-04-15 NOTE — H&P (View-Only) (Signed)
Pt here for PUS/SHG for PMB.  Pt with known uterine fibroids. Images reviewed with pt- Uterus notable for 10 fibroids, largest 3cm, 1 submucosal noted with feeder vessel.  EMS difficult to define, appears thin. ovaries are atrophic appearing. Recommend proceeding to [facility], consent obtained. Speculum placed, cervix cleansed with betadine, insemination catheter placed and walls gently distended with NS.  Anterior wall mass noted almost entirely intracavitary, measuring 1.7cm  Findings reviewed, recommend hysteroscopy to remove. Procedure outlined to pt, operative photos reviewed and explained. Risks of bleeding, infection, uterine perforation discussed.  Possible need for laparoscopy/-otomy for repair after perforation.  Risks of DVT/PE formation post-op, PAS will be placed.  Anesthetic risks discussed, as well as fluid overload.Operative precautions reviewed. Nullipara-cytotec priming of cervix recommended.  Past Medical History  Diagnosis Date  . Abnormal uterine bleeding   . Fibroid   . Psoriatic arthritis    History reviewed. No pertinent past surgical history.  No Known Allergies  Ht 5' 8.5" (1.74 m)  LMP 02/04/2014 General appearance: alert, cooperative and appears stated age Lungs: clear to auscultation bilaterally Heart: regular rate and rhythm, S1, S2 normal, no murmur, click, rub or gallop Abdomen: soft, non-tender; bowel sounds normal; no masses,  no organomegaly Pelvic: cervix normal in appearance, external genitalia normal, no adnexal masses or tenderness, no cervical motion tenderness, vagina normal without discharge and utuerus enlarged and irregular Extremities: extremities normal, atraumatic, no cyanosis or edema  Questions addressed 66m spent counseling,  >50% face to face

## 2014-04-18 ENCOUNTER — Encounter (HOSPITAL_COMMUNITY): Payer: Self-pay | Admitting: Gynecology

## 2014-04-20 ENCOUNTER — Telehealth: Payer: Self-pay

## 2014-04-20 NOTE — Telephone Encounter (Signed)
Spoke with patient. Advised of message as seen below from Dr.Lathrop. Patient agreeable and verbalizes understanding.  Routing to provider for final review. Patient agreeable to disposition. Will close encounter

## 2014-04-20 NOTE — Telephone Encounter (Signed)
Message copied by Jasmine Awe on Wed Apr 20, 2014  8:34 AM ------      Message from: Elveria Rising      Created: Tue Apr 19, 2014 12:54 PM       Inform small amount of tissue but benign endometrium, will discuss at ov ------

## 2014-04-25 NOTE — Brief Op Note (Signed)
04/15/2014  4:15 PM  PATIENT:  Brittney Gallegos  53 y.o. female  PRE-OPERATIVE DIAGNOSIS:  PMB, submucosal fibroid  POST-OPERATIVE DIAGNOSIS:  PMB, submucosal fibroid  PROCEDURE:  Procedure(s): Hysteroscopy, Attempted DILATATION & CURETTAGE,  with attempted TRUCLEAR (N/A)  SURGEON:  Surgeon(s) and Role:    * Azalia Bilis, MD - Primary  PHYSICIAN ASSISTANT:   ASSISTANTS: none   ANESTHESIA:   general  EBL:     BLOOD ADMINISTERED:none  DRAINS: none   LOCAL MEDICATIONS USED:  MARCAINE   , LIDOCAINE  and Amount: 15 ml  SPECIMEN:  Source of Specimen:  uterine  DISPOSITION OF SPECIMEN:  PATHOLOGY  COUNTS:  YES  TOURNIQUET:  * No tourniquets in log *  DICTATION: .Other Dictation: Dictation Number [id number]  PLAN OF CARE: Discharge to home after PACU  PATIENT DISPOSITION:  PACU - hemodynamically stable.   Delay start of Pharmacological VTE agent (>24hrs) due to surgical blood loss or risk of bleeding: not applicable

## 2014-04-26 NOTE — Op Note (Signed)
Brittney Gallegos, Brittney Gallegos                ACCOUNT NO.:  [id number]  MEDICAL RECORD NO.:  [id number]  LOCATION:  [facility]                          FACILITY:  [facility]  PHYSICIAN:  Alver Sorrow. Charlies Constable, M.D. DATE OF BIRTH:  1961-05-09  DATE OF PROCEDURE:  04/15/2014 DATE OF DISCHARGE:  04/15/2014                              OPERATIVE REPORT   PREOPERATIVE DIAGNOSIS: 1. Postmenopausal bleeding. 2. Multi-fibroid uterus.  PROCEDURE:  Aborted D and C, hysteroscopy with TRUCLEAR.  SURGEON:  Alver Sorrow. Charlies Constable, M.D..  ANESTHESIA:  General.  ESTIMATED BLOOD LOSS:  Minimal.  I AND O DEFICIT:  Normal saline distending media was 25.  INDICATIONS:  The patient is a nulligravida with an episode of postmenopausal bleeding, who was known to have a multi-fibroid uterus. The patient had a mass in the cavity with a feeder vessel noted suspicious for a polyp at that time sonohysterogram and presented now to the OR for D and C hysteroscopy.  Of note, the patient had an attempted endometrial ablation by another provider, but was unsuccessful due to inability to get into the cavity per the patient report.  FINDINGS:  There was a lower uterine segment/upper cervical fibroid that obstructed the ability to adequately enter the cavity.  The cervix was without any defects.  The uterus sounded to 8.  COMPLICATIONS:  None.  PATHOLOGY:  Endometrial Curettings.  PROCEDURE IN THE DETAIL:  The patient was taken to the operating room, placed in the dorsal lithotomy position.  Prepped and draped in usual sterile fashion.  Bimanual exam was performed.  Orientation of the uterus was confirmed.  The patient had taken 2 Cytotec; one in the evening before and one in the morning to soften her cervix.  The cervix was noted to be pinpoint, but rather soft.  It was grasped with a single- tooth tenaculum and paracervical block of equal parts, 2% lidocaine and 0.25% Marcaine was then injected circumferentially.  The cervix was able to  be dilated without resistance, however, on several attempts of passing the uterine sound could not get beyond the depth of 4.  The cervix was dilated up again only to the point of depth of 4 at which point, there was what felt to be a very hard mass.  The small hysteroscope was then advanced through the cervix and a pinpoint lesion was noted in the anterolateral area, which could be consistent with 10 o'clock.  The diagnostic hysteroscope was then used as the dilation that required was less and despite this, we could not relax the what appeared to be the os opening in order to allow the hysteroscope to pass.  The Hegar dilators were then brought into the room and starting at 2.5, gentle dilation occurred.  It felt to be slightly deeper, then obtained with the standard Kennon Rounds and again another attempt to get the 2.9 hysteroscope was unsuccessful.  We were confident that the perforation had not occurred as the I and O deficit did not change throughout the procedure.  An OS finder was then used to confirm the orientation of the canal.  Using the os finder, we then directed the [facility] dilators and the fibroid was quite palpable on the  posterolateral aspect.  This 7 Kennon Rounds was able to be passed, however, despite multiple attempts the 7.5 could not without what was felt to be excessive force.  An endometrial Pipelle was able to be advanced through and into the cavity, which felt to have a good cry.  Of note, an ultrasound of the endometrial stripe appeared to be very thin.  Mostly, hysteroscopic fluid and some tissue was obtained on 3 passes.  At this point, the procedure was aborted as it seemed unlikely that the 2.9 hysteroscope was going to be able to enter the cavity and we were now able to at least sample the endometrium and the patient had been stable throughout.  The patient tolerated the procedure well.  Sponge, lap, and needle counts were correct x2.  She was extubated to the OR and  transferred to the recovery room in stable condition.     Alver Sorrow. Charlies Constable, M.D.     THL/MEDQ  D:  04/25/2014  T:  04/26/2014  Job:  [id number]

## 2014-04-29 ENCOUNTER — Ambulatory Visit (INDEPENDENT_AMBULATORY_CARE_PROVIDER_SITE_OTHER): Payer: Managed Care, Other (non HMO) | Admitting: Gynecology

## 2014-04-29 VITALS — BP 124/66 | Resp 14 | Ht 68.5 in | Wt 178.0 lb

## 2014-04-29 DIAGNOSIS — D250 Submucous leiomyoma of uterus: Secondary | ICD-10-CM

## 2014-04-29 DIAGNOSIS — N950 Postmenopausal bleeding: Secondary | ICD-10-CM

## 2014-05-09 NOTE — Progress Notes (Signed)
Subjective:     Patient ID: Brittney Gallegos, female   DOB: 02-May-1961, 53 y.o.   MRN: [id number]  HPI Comments: Pt here for post op visit, she is s/p aborted D&C hysteroscopy due to fibroid located at top of cervix.  Pt is without complaints, no vaginal bleeding    Review of Systems     Objective:   Physical Exam  Nursing note and vitals reviewed. Genitourinary:   Pelvic: External genitalia:  no lesions              Urethra:  normal appearing urethra with no masses, tenderness or lesions              Bartholins and Skenes: normal                 Vagina: normal appearing vagina with normal color and discharge, no lesions              Cervix: normal appearance                      Bimanual Exam:  Uterus: irregular, non-tender                                    Adnexa: normal adnexa in size, nontender and no masses                                            Assessment:     mutifibroid uterus PMB with mass     Plan:     Operative complications reivewed Concerns re uterine perforation due to obstructing fibroid accepted by pt emb revealed benign tissue, no polyp identified Pt offered repeat PUS to assess, offered hysterectomy to diagnose and treat, offered another hysteroscopy by another provider. Pt will consider her options  She is aware that if she bleeds again, she will need evaluation-she expressed understanding

## 2014-05-27 ENCOUNTER — Encounter: Payer: Self-pay | Admitting: Family Medicine

## 2014-05-27 ENCOUNTER — Ambulatory Visit (INDEPENDENT_AMBULATORY_CARE_PROVIDER_SITE_OTHER): Payer: Managed Care, Other (non HMO) | Admitting: Family Medicine

## 2014-05-27 VITALS — BP 126/70 | HR 66 | Wt 182.0 lb

## 2014-05-27 DIAGNOSIS — M545 Low back pain, unspecified: Secondary | ICD-10-CM

## 2014-05-27 DIAGNOSIS — M533 Sacrococcygeal disorders, not elsewhere classified: Secondary | ICD-10-CM

## 2014-05-27 NOTE — Progress Notes (Signed)
   Subjective:    Patient ID: Brittney Gallegos, female    DOB: 10-25-1960, 53 y.o.   MRN: [id number]  HPI 53 year old female presents today with low back pain on the coccyx.  She fell down stairs 1 year ago and pain is still lingering.  She has no pain with ambulation, and only notices it when sitting or laying in a certain position.  She has had to sit on a soft pad at work for comfort.  Takes ibuprofen routinely.  Has not tried ice/ heat.  Has had no decreased ROM, no numbness or tingling into the leg.  No loss of bowel or bladder control.  No fevers.  Has not had any prior imaging completed.   Also has a history of psoriatic arthritis, managed by rheumatology, and is on plaquenil.  She believes she has had recent flares.   Review of Systems  Constitutional: Negative for fever, activity change, appetite change and fatigue.  Respiratory: Negative for cough, chest tightness and shortness of breath.   Cardiovascular: Negative for chest pain.  Gastrointestinal: Negative for nausea, vomiting, abdominal pain, diarrhea, constipation, anal bleeding and rectal pain.  Genitourinary: Negative for dysuria, difficulty urinating and pelvic pain.  Musculoskeletal: Positive for back pain. Negative for arthralgias, gait problem, joint swelling and myalgias.  Neurological: Negative for dizziness, syncope, weakness, light-headedness, numbness and headaches.       Objective:   Physical Exam  Nursing note and vitals reviewed. Constitutional: She is oriented to person, place, and time. She appears well-developed and well-nourished. No distress.  Cardiovascular: Normal rate, regular rhythm, normal heart sounds and intact distal pulses.   No murmur heard. Pulmonary/Chest: Effort normal and breath sounds normal. No respiratory distress. She has no wheezes.  Musculoskeletal: Normal range of motion. She exhibits no edema and no tenderness.  Neurological: She is alert and oriented to person, place, and time. She has  normal reflexes. No cranial nerve deficit.  Skin: Skin is warm and dry. She is not diaphoretic.        Assessment & Plan:  1. Low back pain in the sacral-coccyx region- obtain plain film lumbar/ sacral/ coccyx  X-ray.  Encouraged to try ice/ heat to see if that provides relief.  Continue ibuprofen for pain relief.  Discuss psoriatic arthritis with rheumatologist at next visit.  If X-ray shows nothing, and pain has not dissipated, further MRI imaging was discussed.    Joseph Art, PA-S  As above.  We did not check for perianal abscess since she has had pain for one year following fall.  In addition to the coccyx pain, she has low lumbar pain for several months not relieved with heat, ice, NSAIDS.  Given duration will check lumbar films and may need MRI to further assess.  Bruce Burchette M.D.

## 2014-05-27 NOTE — Patient Instructions (Signed)
Tailbone Injury  The tailbone (coccyx) is the small bone at the lower end of the spine. A tailbone injury may involve stretched ligaments, bruising, or a broken bone (fracture). Women are more vulnerable to this injury due to having a wider pelvis.  CAUSES   This type of injury typically occurs from falling and landing on the tailbone. Repeated strain or friction from actions such as rowing and bicycling may also injure the area. The tailbone can be injured during childbirth. Infections or tumors may also press on the tailbone and cause pain. Sometimes, the cause of injury is unknown.  SYMPTOMS    Bruising.   Pain when sitting.   Painful bowel movements.   In women, pain during intercourse.  DIAGNOSIS   Your caregiver can diagnose a tailbone injury based on your symptoms and a physical exam. X-rays may be taken if a fracture is suspected. Your caregiver may also use an MRI scan imaging test to evaluate your symptoms.  TREATMENT   Your caregiver may prescribe medicines to help relieve your pain. Most tailbone injuries heal on their own in 4 to 6 weeks. However, if the injury is caused by an infection or tumor, the recovery period may vary.  PREVENTION   Wear appropriate padding and sports gear when bicycling and rowing. This can help prevent an injury from repeated strain or friction.  HOME CARE INSTRUCTIONS    Put ice on the injured area.   Put ice in a plastic bag.   Place a towel between your skin and the bag.   Leave the ice on for 15-20 minutes, every hour while awake for the first 1 to 2 days.   Sit on a large, rubber or inflated ring or cushion to ease your pain. Lean forward when sitting to help decrease discomfort.   Avoid sitting for long periods of time.   Increase your activity as the pain allows.   Only take over-the-counter or prescription medicines for pain, discomfort, or fever as directed by your caregiver.   You may use stool softeners if it is painful to have a bowel movement, or as  directed by your caregiver.   Eat a diet with plenty of fiber to help prevent constipation.   Keep all follow-up appointments as directed by your caregiver.  SEEK MEDICAL CARE IF:    Your pain becomes worse.   Your bowel movements cause a great deal of discomfort.   You are unable to have a bowel movement.   You have a fever.  MAKE SURE YOU:   Understand these instructions.   Will watch your condition.   Will get help right away if you are not doing well or get worse.  Document Released: 07/12/2000 Document Revised: 10/07/2011 Document Reviewed: 02/07/2011  [employer] Patient Information 2015 [employer]. This information is not intended to replace advice given to you by your health care provider. Make sure you discuss any questions you have with your health care provider.

## 2014-06-03 ENCOUNTER — Ambulatory Visit (INDEPENDENT_AMBULATORY_CARE_PROVIDER_SITE_OTHER)
Admission: RE | Admit: 2014-06-03 | Discharge: 2014-06-03 | Disposition: A | Discharge status: Home / Self Care | Payer: PRIVATE HEALTH INSURANCE | Source: Ambulatory Visit | Attending: Family Medicine | Admitting: Family Medicine

## 2014-06-03 DIAGNOSIS — M545 Low back pain, unspecified: Secondary | ICD-10-CM

## 2014-06-06 ENCOUNTER — Telehealth: Payer: Self-pay | Admitting: Certified Nurse Midwife

## 2014-06-06 NOTE — Telephone Encounter (Signed)
Pt left message on voicemail regarding a claim that she thinks she paid too much on. Would like someone to call her.

## 2014-06-07 NOTE — Telephone Encounter (Signed)
Patient calling to check on status of call. °

## 2014-06-09 NOTE — Telephone Encounter (Signed)
Pt is calling to talk with Janett Billow. She states she thinks she is due a refund

## 2014-06-10 NOTE — Telephone Encounter (Signed)
Spoke to patient regarding her credit/refund

## 2014-09-17 ENCOUNTER — Other Ambulatory Visit: Payer: Self-pay | Admitting: Oncology

## 2014-09-17 DIAGNOSIS — D696 Thrombocytopenia, unspecified: Secondary | ICD-10-CM

## 2014-09-19 ENCOUNTER — Telehealth: Payer: Self-pay | Admitting: Oncology

## 2014-09-19 NOTE — Telephone Encounter (Signed)
per pof to sch NEW PATIENT-sent to Artemio Aly via staff message

## 2014-09-21 ENCOUNTER — Telehealth: Payer: Self-pay | Admitting: Oncology

## 2014-09-21 NOTE — Telephone Encounter (Signed)
S/W PT IN REF TO NP APPT ON 10/24/14@4 :00 PT IS AWARE OF LAB APPT. ON 10/18/14@8 :30

## 2014-10-12 ENCOUNTER — Telehealth: Payer: Self-pay | Admitting: *Deleted

## 2014-10-12 ENCOUNTER — Telehealth: Payer: Self-pay | Admitting: Oncology

## 2014-10-12 NOTE — Telephone Encounter (Signed)
Moved pts new pt appt per val/verbal and she has made the patient aware  anne

## 2014-10-12 NOTE — Telephone Encounter (Signed)
Patient reporting sinus congestion and some discomfort behind her eyes, no fever.  She just moved from [place] and doesn't know whether she might have seasonal allergies.  Advised OTC loratidine, and the use of a Neti pot. She does have experience with a Neti pot and is willing to use it.  She has used some Aleve D with relief of symptoms. She wants to know whether having this sinus issue would impact her lab work that is scheduled for 10/18/14.  Reassured her that she should keep her lab appointment as scheduled unless she should experience fever and signs of an infection.

## 2014-10-17 ENCOUNTER — Ambulatory Visit (INDEPENDENT_AMBULATORY_CARE_PROVIDER_SITE_OTHER): Payer: Managed Care, Other (non HMO) | Admitting: Family Medicine

## 2014-10-17 ENCOUNTER — Encounter: Payer: Self-pay | Admitting: Family Medicine

## 2014-10-17 VITALS — BP 124/74 | HR 64 | Temp 98.3°F | Wt 183.0 lb

## 2014-10-17 DIAGNOSIS — J0110 Acute frontal sinusitis, unspecified: Secondary | ICD-10-CM

## 2014-10-17 MED ORDER — AMOXICILLIN-POT CLAVULANATE 875-125 MG PO TABS: 1.0000 | ORAL_TABLET | Freq: Two times a day (BID) | ORAL | Status: DC

## 2014-10-17 NOTE — Patient Instructions (Signed)

## 2014-10-17 NOTE — Progress Notes (Signed)
Pre visit review using our clinic review tool, if applicable. No additional management support is needed unless otherwise documented below in the visit note. 

## 2014-10-17 NOTE — Progress Notes (Signed)
   Subjective:    Patient ID: Brittney Gallegos, female    DOB: 1961-04-17, 54 y.o.   MRN: [id number]  HPI  Acute visit. Patient had onset about 2 or 3 weeks ago of right frontal sinus pressure. Minimal headache. No fever. Symptoms very localized to the right frontal region. She took Aleve D along with Mucinex and noticed some greenish nasal discharge afterwards. Last week one of her friends gave her Augmentin 500 mg which she took twice daily for 3 days. She felt somewhat better when she was taking this but after discontinuing Saturday symptoms returned yesterday. No cough. No sore throat.  Past Medical History  Diagnosis Date  . Abnormal uterine bleeding   . Fibroid   . Psoriatic arthritis    Past Surgical History  Procedure Laterality Date  . No past surgeries    . Dilatation & curettage/hysteroscopy with trueclear N/A 04/15/2014    Procedure: Hysteroscopy, Attempted DILATATION & CURETTAGE,  with attempted TRUCLEAR;  Surgeon: Azalia Bilis, MD;  Location: [facility];  Service: Gynecology;  Laterality: N/A;    reports that she has never smoked. She has never used smokeless tobacco. She reports that she drinks about 1.5 oz of alcohol per week. She reports that she does not use illicit drugs. family history includes Breast cancer (age of onset: 22) in her sister; Cancer in her father; Heart disease in her father and mother; Hypertension in her father and mother. No Known Allergies   Review of Systems  Constitutional: Negative for fever and chills.  HENT: Positive for congestion and sinus pressure. Negative for sore throat.   Respiratory: Negative for cough.   Neurological: Positive for headaches.       Objective:   Physical Exam  Constitutional: She appears well-developed and well-nourished.  HENT:  Right Ear: External ear normal.  Left Ear: External ear normal.  Mouth/Throat: Oropharynx is clear and moist.  Neck: Neck supple.  Cardiovascular: Normal rate and regular rhythm.     Pulmonary/Chest: Effort normal and breath sounds normal. No respiratory distress. She has no wheezes. She has no rales.  Lymphadenopathy:    She has no cervical adenopathy.          Assessment & Plan:  Acute right frontal sinusitis. Not fully treated. Augmentin 875 mg twice daily for 10 days. Continue Mucinex and decongestant. Follow-up as needed

## 2014-10-18 ENCOUNTER — Other Ambulatory Visit: Payer: Self-pay

## 2014-10-18 ENCOUNTER — Other Ambulatory Visit (HOSPITAL_BASED_OUTPATIENT_CLINIC_OR_DEPARTMENT_OTHER): Payer: Managed Care, Other (non HMO)

## 2014-10-18 ENCOUNTER — Other Ambulatory Visit: Payer: Self-pay | Admitting: Oncology

## 2014-10-18 ENCOUNTER — Ambulatory Visit (HOSPITAL_COMMUNITY)
Admission: RE | Admit: 2014-10-18 | Discharge: 2014-10-18 | Disposition: A | Discharge status: Home / Self Care | Payer: Managed Care, Other (non HMO) | Source: Ambulatory Visit | Attending: Oncology | Admitting: Oncology

## 2014-10-18 DIAGNOSIS — D689 Coagulation defect, unspecified: Secondary | ICD-10-CM

## 2014-10-18 DIAGNOSIS — D759 Disease of blood and blood-forming organs, unspecified: Secondary | ICD-10-CM | POA: Diagnosis present

## 2014-10-18 DIAGNOSIS — Z780 Asymptomatic menopausal state: Secondary | ICD-10-CM

## 2014-10-18 DIAGNOSIS — D696 Thrombocytopenia, unspecified: Secondary | ICD-10-CM

## 2014-10-18 DIAGNOSIS — Z9889 Other specified postprocedural states: Secondary | ICD-10-CM

## 2014-10-18 DIAGNOSIS — D702 Other drug-induced agranulocytosis: Secondary | ICD-10-CM | POA: Diagnosis not present

## 2014-10-18 DIAGNOSIS — L4050 Arthropathic psoriasis, unspecified: Secondary | ICD-10-CM

## 2014-10-18 DIAGNOSIS — Z86018 Personal history of other benign neoplasm: Secondary | ICD-10-CM

## 2014-10-18 LAB — CBC & DIFF AND RETIC
BASO%: 0.7 % (ref 0.0–2.0)
BASOS ABS: 0 10*3/uL (ref 0.0–0.1)
EOS ABS: 0.1 10*3/uL (ref 0.0–0.5)
EOS%: 2.1 % (ref 0.0–7.0)
HCT: 40.5 % (ref 34.8–46.6)
HEMOGLOBIN: 13.7 g/dL (ref 11.6–15.9)
IMMATURE RETIC FRACT: 5.9 % (ref 1.60–10.00)
LYMPH#: 1 10*3/uL (ref 0.9–3.3)
LYMPH%: 36 % (ref 14.0–49.7)
MCH: 32.4 pg (ref 25.1–34.0)
MCHC: 33.8 g/dL (ref 31.5–36.0)
MCV: 95.7 fL (ref 79.5–101.0)
MONO#: 0.2 10*3/uL (ref 0.1–0.9)
MONO%: 8.3 % (ref 0.0–14.0)
NEUT%: 52.9 % (ref 38.4–76.8)
NEUTROS ABS: 1.5 10*3/uL (ref 1.5–6.5)
Platelets: 103 10*3/uL — ABNORMAL LOW (ref 145–400)
RBC: 4.23 10*6/uL (ref 3.70–5.45)
RDW: 12.4 % (ref 11.2–14.5)
RETIC %: 0.92 % (ref 0.70–2.10)
Retic Ct Abs: 38.92 10*3/uL (ref 33.70–90.70)
WBC: 2.9 10*3/uL — AB (ref 3.9–10.3)

## 2014-10-18 LAB — CHCC SMEAR

## 2014-10-19 ENCOUNTER — Other Ambulatory Visit: Payer: Self-pay | Admitting: Oncology

## 2014-10-19 LAB — PROTHROMBIN TIME
INR: 0.99 (ref <1.50)
Prothrombin Time: 13.1 seconds (ref 11.6–15.2)

## 2014-10-19 LAB — HEPATIC FUNCTION PANEL
ALK PHOS: 60 U/L (ref 39–117)
ALT: 10 U/L (ref 0–35)
AST: 15 U/L (ref 0–37)
Albumin: 4.2 g/dL (ref 3.5–5.2)
BILIRUBIN INDIRECT: 0.4 mg/dL (ref 0.2–1.2)
Bilirubin, Direct: 0.1 mg/dL (ref 0.0–0.3)
TOTAL PROTEIN: 6.5 g/dL (ref 6.0–8.3)
Total Bilirubin: 0.5 mg/dL (ref 0.2–1.2)

## 2014-10-19 LAB — FIBRINOGEN: Fibrinogen: 324 mg/dL (ref 204–475)

## 2014-10-19 LAB — APTT: aPTT: 33 seconds (ref 24–37)

## 2014-10-19 LAB — D-DIMER, QUANTITATIVE: D-Dimer, Quant: 0.39 ug/mL-FEU (ref 0.00–0.48)

## 2014-10-19 LAB — SEDIMENTATION RATE: SED RATE: 5 mm/h (ref 0–30)

## 2014-10-21 ENCOUNTER — Ambulatory Visit (HOSPITAL_BASED_OUTPATIENT_CLINIC_OR_DEPARTMENT_OTHER): Payer: Managed Care, Other (non HMO) | Admitting: Oncology

## 2014-10-21 ENCOUNTER — Ambulatory Visit (HOSPITAL_BASED_OUTPATIENT_CLINIC_OR_DEPARTMENT_OTHER): Payer: Managed Care, Other (non HMO)

## 2014-10-21 VITALS — BP 125/67 | HR 60 | Temp 98.4°F | Resp 18 | Ht 68.5 in | Wt 183.5 lb

## 2014-10-21 DIAGNOSIS — L4050 Arthropathic psoriasis, unspecified: Secondary | ICD-10-CM

## 2014-10-21 DIAGNOSIS — D702 Other drug-induced agranulocytosis: Secondary | ICD-10-CM | POA: Diagnosis not present

## 2014-10-21 DIAGNOSIS — D696 Thrombocytopenia, unspecified: Secondary | ICD-10-CM | POA: Diagnosis not present

## 2014-10-21 NOTE — Progress Notes (Signed)
[facility] [facility]  [phone] [phone]     ID: Brittney Gallegos DOB: 06/28/1961  MR#: [id number]  NLG#:[id number]  Patient Care Team: Eulas Post, MD as PCP - General (Family Medicine) PCP: Eulas Post, MD GYN: Brittney Mages. Edwinna Areola MD SU:  OTHER MD: Brittney Gallegos   CHIEF COMPLAINT: Leukopenia and thrombocytopenia  CURRENT TREATMENT: Observation   HISTORY OF PRESENT ILNESS: Brittney Gallegos is followed by rheumatology for a history of psoriatic arthritis and rheumatoid arthritis. I do not have a records regarding her diagnostic workup. She was referred for evaluation of thrombocytopenia and leukopenia.  We do have records of the patient's platelet and total white cell count dating back to 74. Between 1991 and 2009, before the patient had a rheumatologic diagnosis or started any rheumatologic medications, her platelet count ranged from 84,000 TO 190,000, with the majority of readings clustering around 100,000+ or -15,000. During that period of time also she had 2 white cell readings under 4000 (in 1999 and June 2008). Other readings were in the 11-5998 range. I do not have differentials.  More recently the total white cell count fell just under 3000. Given that on the thrombocytopenia, with 2 cell lines involved, hematologic evaluation was requested.  The patient's subsequent history is as detailed below.  INTERVAL HISTORY: Brittney Gallegos was evaluated in the hematology clinic 10/21/2014 accompanied by her sister Brittney Gallegos  REVIEW OF SYSTEMS: Brittney Gallegos complains of fatigue and overall weakness, though she does work full time. She does not exercise regularly. She bruises easily. She has back and joint pain which is significant but not more intense than before. In addition to her arthritis problems she has currently sinus symptoms, and she has a history of an irregular heartbeat. A detailed review of systems today was otherwise noncontributory and in particular  she denies recent fevers, drenching sweats, unexplained weight loss, rash, or evident adenopathy.  PAST MEDICAL HISTORY: Past Medical History  Diagnosis Date  . Abnormal uterine bleeding   . Fibroid   . Psoriatic arthritis     PAST SURGICAL HISTORY: Past Surgical History  Procedure Laterality Date  . No past surgeries    . Dilatation & curettage/hysteroscopy with trueclear N/A 04/15/2014    Procedure: Hysteroscopy, Attempted DILATATION & CURETTAGE,  with attempted TRUCLEAR;  Surgeon: Brittney Bilis, MD;  Location: [facility];  Service: Gynecology;  Laterality: N/A;    FAMILY HISTORY Family History  Problem Relation Age of Onset  . Hypertension Mother   . Heart disease Mother   . Cancer Father     bladder  . Hypertension Father   . Heart disease Father   . Breast cancer Sister 64   the patient's father is 8 and the patient's mother is 31 years old, as of March 2016. The patient has one sister, and 1 brother. There is no history of blood disorders in the family to the patient's knowledge  GYNECOLOGIC HISTORY:  Patient's last menstrual period was 02/04/2014. Menarche age 75, the patient is GX P0. She went to the change of life around age 64. She did not take hormone replacement.  SOCIAL HISTORY:  Brittney Gallegos works as an Optometrist. She lived in [place] for many years but moved to this area to be closer to her family. She is divorced and lives alone, with no pets. She attends the [employer].    ADVANCED DIRECTIVES: The patient's mother is her healthcare power of attorney.   HEALTH MAINTENANCE: History  Substance Use Topics  .  Smoking status: Never Smoker   . Smokeless tobacco: Never Used  . Alcohol Use: 1.5 oz/week    3 drink(s) per week     Colonoscopy:  PAP:  Bone density:  Lipid panel:  No Known Allergies  Current Outpatient Prescriptions  Medication Sig Dispense Refill  . amoxicillin-clavulanate (AUGMENTIN) 875-125 MG per tablet Take 1 tablet by mouth 2  (two) times daily. 20 tablet 0  . aspirin EC 81 MG tablet Take by mouth daily.    . B Complex Vitamins (VITAMIN B COMPLEX PO) Take by mouth daily.    . cholecalciferol (VITAMIN Gallegos) 1000 UNITS tablet Take 1,000 Units by mouth daily.    . Evening Primrose Oil 1000 MG CAPS Take 1 capsule by mouth daily.    . hydroxychloroquine (PLAQUENIL) 200 MG tablet Take 400 mg by mouth daily. Take two 200 mg tablets (400 mg total) daily    . MAGNESIUM PO Take 1 tablet by mouth 3 (three) times a week.     . Nutritional Supplements (JUICE PLUS FIBRE PO) Take by mouth daily.    . Omega-3 Fatty Acids (FISH OIL) 1000 MG CAPS Take 1 capsule by mouth daily.     . temazepam (RESTORIL) 15 MG capsule Take 15 mg by mouth at bedtime as needed.   0   No current facility-administered medications for this visit.    OBJECTIVE: Middle-aged white woman in no acute distress Filed Vitals:   10/21/14 1249  BP: 125/67  Pulse: 60  Temp: 98.4 F (36.9 C)  Resp: 18     Body mass index is 27.49 kg/(m^2).    ECOG FS:1 - Symptomatic but completely ambulatory  Ocular: Sclerae unicteric, pupils round and equal Ear-nose-throat: Oropharynx clear, dentition in good repair Lymphatic: No cervical or supraclavicular adenopathy Lungs no rales or rhonchi, good excursion bilaterally Heart regular rate and rhythm, no murmur appreciated Abd soft, nontender, positive bowel sounds MSK no focal spinal tenderness, no joint edema noted Neuro: non-focal, well-oriented, appropriate affect Breasts: Deferred   LAB RESULTS: Results for Brittney Gallegos, Brittney Gallegos (MRN [id number]) as of 10/23/2014 12:06  Ref. Range 10/18/2014 09:34  Gallegos-Dimer, Quant Latest Range: 0.00-0.48 ug/mL-FEU 0.39  Fibrinogen Latest Range: 204-475 mg/dL 324  Results for Brittney Gallegos, Brittney Gallegos (MRN [id number]) as of 10/23/2014 12:06  Ref. Range 10/18/2014 09:34  APTT Latest Range: 24-37 seconds 33  Results for Brittney Gallegos, Brittney Gallegos (MRN [id number]) as of 10/23/2014 12:06  Ref. Range 10/18/2014 11:50    Sed Rate Latest Range: 0-30 mm/hr 5    CMP     Component Value Date/Time   PROT 6.5 10/18/2014 0934   ALBUMIN 4.2 10/18/2014 0934   AST 15 10/18/2014 0934   ALT 10 10/18/2014 0934   ALKPHOS 60 10/18/2014 0934   BILITOT 0.5 10/18/2014 0934    INo results found for: SPEP, UPEP  Lab Results  Component Value Date   WBC 2.9* 10/18/2014   NEUTROABS 1.5 10/18/2014   HGB 13.7 10/18/2014   HCT 40.5 10/18/2014   MCV 95.7 10/18/2014   PLT 103* 10/18/2014      Chemistry   No results found for: NA, K, CL, CO2, BUN, CREATININE, GLU    Component Value Date/Time   ALKPHOS 60 10/18/2014 0934   AST 15 10/18/2014 0934   ALT 10 10/18/2014 0934   BILITOT 0.5 10/18/2014 0934       No results found for: LABCA2  No components found for: LABCA125   Recent Labs Lab 10/18/14 0934  INR 0.99  Urinalysis    Component Value Date/Time   BILIRUBINUR n 03/04/2014 1325   PROTEINUR n 03/04/2014 1325   UROBILINOGEN negative 03/04/2014 1325   NITRITE n 03/04/2014 1325   LEUKOCYTESUR Negative 03/04/2014 1325    STUDIES: US Abdomen Complete  10/18/2014   CLINICAL DATA:  Thrombus cytopenia  EXAM: ULTRASOUND ABDOMEN COMPLETE  COMPARISON:  None.  FINDINGS: Gallbladder: No gallstones or wall thickening visualized. No sonographic Murphy sign noted.  Common bile duct: Diameter: 3.7 mm  Liver: The liver is normal in echotexture and contour. There is no focal mass or ductal dilation.  IVC: No abnormality visualized.  Pancreas: Visualized portion unremarkable.  Spleen: The spleen measures 8.8 cm longitudinally by 9.1 cm AP by 9.8 cm transversely and has a calculated volume of 392 cc which is normal. Its echotexture is normal.  Right Kidney: Length: 11.6 cm. Echogenicity within normal limits. No mass or hydronephrosis visualized.  Left Kidney: Length: 12.0 cm. Echogenicity within normal limits. No mass or hydronephrosis visualized.  Abdominal aorta: No aneurysm visualized.  Other findings: There is no  ascites.  IMPRESSION: Normal abdominal ultrasound.  The liver and spleen are unremarkable.   Electronically Signed   By: David  Martinique   On: 10/18/2014 09:16    ASSESSMENT: 54 y.o. [place] woman with a history of rheumatoid arthritis, on Plaquenil, with long-standing leukopenia and thrombocytopenia  (1) likely immune thrombocytopenia: Smear shows no artifactual platelet clumping, no schistocytes, which argues against DIC or TTP (as does the overall clinical picture); no splenomegaly, and no exposure to heparin, leaving autoimmune thrombocytopenia as the likely default diagnosis  (2) drug-induced leukopenia: with an unremarkable differential and no peripheral morphology suggestive of a primary bone marrow process (no nucleated red cells, tailed poikilocytes, or left shift in the myeloid series)  PLAN: We reviewed lab work on Brittney Gallegos all the way back to 1991. Her platelets have "always" been low, between 69,000 and an 132,000 over a period of 25 years, with no reading below 50,000. 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 The only caveat is that the patient's sister also has a similar picture. Accordingly some genetic component that I cannot further evaluate may be present. However so long as the platelet count remains above 50,000, no intervention is needed.  The white cell count before 2012 ranged between 3.6 and 6.2, with only 2 out of 8 readings below 4.0 (3.6 in 1999, 13.8 in 2008). By contrast all the readings since starting her treatment for rheumatoid arthritis have been between 3 and 4 (more specifically between 2.9 and 3.7). Accordingly her immunosuppressive medication, namely Plaquenil, may be contributing. Also there may be again a familial issue, with this patient's low white cell setting closer to 3.5 (which is the normal low range for African-Americans) than 4.0 (which is the normal low setting for white Americans.) Note that there have  been no frequent or unusual infections  One way to settle this question would be to either lower the patient's plaquenil dose and see if the white cell count improves, or switch her to an agent that does not commonly affect the marrow, such as possibly rituximab. I am leaving this at the discretion of Brittney Gallegos's rheumatologist, but I do not believe any change beyond further observation is necessary as I do not believe there is a primary bone marrow problem of concern here.  Incidentally, Brittney Gallegos has not yet had her screening colonoscopy and we are putting a referral to get that accomplished.  She had some questions regarding CBG treatment for rheumatoid arthritis which I was not able to answer.  I will be glad to see Brittney Gallegos at any point in the future if I when the need arises, but as of now no further appointments have been made for her here.  Chauncey Cruel, MD   10/21/2014 1:42 PM Medical Oncology and Hematology [facility] [address] [place], [place] [address] Tel. [phone]    Fax. [phone]

## 2014-10-24 ENCOUNTER — Other Ambulatory Visit: Payer: PRIVATE HEALTH INSURANCE

## 2014-10-24 ENCOUNTER — Ambulatory Visit: Payer: PRIVATE HEALTH INSURANCE

## 2014-10-24 ENCOUNTER — Ambulatory Visit: Payer: PRIVATE HEALTH INSURANCE | Admitting: Oncology

## 2015-01-03 ENCOUNTER — Telehealth: Payer: Self-pay

## 2015-01-03 NOTE — Telephone Encounter (Signed)
12/30/14 pt had mammogram at [facility]

## 2015-01-10 ENCOUNTER — Telehealth: Payer: Self-pay | Admitting: Family Medicine

## 2015-01-10 ENCOUNTER — Telehealth: Payer: Self-pay | Admitting: Oncology

## 2015-01-10 NOTE — Telephone Encounter (Signed)
Is it okay to order currently is not on patient list.

## 2015-01-10 NOTE — Telephone Encounter (Signed)
Called patient regarding referral in workq that had not been scheduled,pof was created but not sent,patient has been called and she will call for screening appointment

## 2015-01-10 NOTE — Telephone Encounter (Signed)
Yes- Nasonex 2 sprays per nostril once daily with prn refills.

## 2015-01-10 NOTE — Telephone Encounter (Signed)
Pt needs new rx nasonex call into [facility] [address]. Pt has nasal polyp . This med was last prescribed by [facility] [place]

## 2015-01-11 MED ORDER — MOMETASONE FUROATE 50 MCG/ACT NA SUSP: 2.0000 | Freq: Every day | NASAL | Status: DC

## 2015-01-11 NOTE — Telephone Encounter (Signed)
Rx sent to pharmacy   

## 2015-03-10 ENCOUNTER — Ambulatory Visit (INDEPENDENT_AMBULATORY_CARE_PROVIDER_SITE_OTHER): Payer: Managed Care, Other (non HMO) | Admitting: Certified Nurse Midwife

## 2015-03-10 ENCOUNTER — Encounter: Payer: Self-pay | Admitting: Certified Nurse Midwife

## 2015-03-10 VITALS — BP 100/60 | HR 72 | Resp 18 | Ht 68.5 in | Wt 182.0 lb

## 2015-03-10 DIAGNOSIS — Z01419 Encounter for gynecological examination (general) (routine) without abnormal findings: Secondary | ICD-10-CM | POA: Diagnosis not present

## 2015-03-10 DIAGNOSIS — K648 Other hemorrhoids: Secondary | ICD-10-CM

## 2015-03-10 DIAGNOSIS — K644 Residual hemorrhoidal skin tags: Secondary | ICD-10-CM

## 2015-03-10 NOTE — Patient Instructions (Addendum)
EXERCISE AND DIET:  We recommended that you start or continue a regular exercise program for good health. Regular exercise means any activity that makes your heart beat faster and makes you sweat.  We recommend exercising at least 30 minutes per day at least 3 days a week, preferably 4 or 5.  We also recommend a diet low in fat and sugar.  Inactivity, poor dietary choices and obesity can cause diabetes, heart attack, stroke, and kidney damage, among others.    ALCOHOL AND SMOKING:  Women should limit their alcohol intake to no more than 7 drinks/beers/glasses of wine (combined, not each!) per week. Moderation of alcohol intake to this level decreases your risk of breast cancer and liver damage. And of course, no recreational drugs are part of a healthy lifestyle.  And absolutely no smoking or even second hand smoke. Most people know smoking can cause heart and lung diseases, but did you know it also contributes to weakening of your bones? Aging of your skin?  Yellowing of your teeth and nails?  CALCIUM AND VITAMIN D:  Adequate intake of calcium and Vitamin D are recommended.  The recommendations for exact amounts of these supplements seem to change often, but generally speaking 600 mg of calcium (either carbonate or citrate) and 800 units of Vitamin D per day seems prudent. Certain women may benefit from higher intake of Vitamin D.  If you are among these women, your doctor will have told you during your visit.    PAP SMEARS:  Pap smears, to check for cervical cancer or precancers,  have traditionally been done yearly, although recent scientific advances have shown that most women can have pap smears less often.  However, every woman still should have a physical exam from her gynecologist every year. It will include a breast check, inspection of the vulva and vagina to check for abnormal growths or skin changes, a visual exam of the cervix, and then an exam to evaluate the size and shape of the uterus and  ovaries.  And after 54 years of age, a rectal exam is indicated to check for rectal cancers. We will also provide age appropriate advice regarding health maintenance, like when you should have certain vaccines, screening for sexually transmitted diseases, bone density testing, colonoscopy, mammograms, etc.   MAMMOGRAMS:  All women over 40 years old should have a yearly mammogram. Many facilities now offer a "3D" mammogram, which may cost around $50 extra out of pocket. If possible,  we recommend you accept the option to have the 3D mammogram performed.  It both reduces the number of women who will be called back for extra views which then turn out to be normal, and it is better than the routine mammogram at detecting truly abnormal areas.    COLONOSCOPY:  Colonoscopy to screen for colon cancer is recommended for all women at age 50.  We know, you hate the idea of the prep.  We agree, BUT, having colon cancer and not knowing it is worse!!  Colon cancer so often starts as a polyp that can be seen and removed at colonscopy, which can quite literally save your life!  And if your first colonoscopy is normal and you have no family history of colon cancer, most women don't have to have it again for 10 years.  Once every ten years, you can do something that may end up saving your life, right?  We will be happy to help you get it scheduled when you are ready.    Be sure to check your insurance coverage so you understand how much it will cost.  It may be covered as a preventative service at no cost, but you should check your particular policy.     Hemorrhoids Hemorrhoids are puffy (swollen) veins around the rectum or anus. Hemorrhoids can cause pain, itching, bleeding, or irritation. HOME CARE  Eat foods with fiber, such as whole grains, beans, nuts, fruits, and vegetables. Ask your doctor about taking products with added fiber in them (fibersupplements).  Drink enough fluid to keep your pee (urine) clear or pale  yellow.  Exercise often.  Go to the bathroom when you have the urge to poop. Do not wait.  Avoid straining to poop (bowel movement).  Keep the butt area dry and clean. Use wet toilet paper or moist paper towels.  Medicated creams and medicine inserted into the anus (anal suppository) may be used or applied as told.  Only take medicine as told by your doctor.  Take a warm water bath (sitz bath) for 15-20 minutes to ease pain. Do this 3-4 times a day.  Place ice packs on the area if it is tender or puffy. Use the ice packs between the warm water baths.  Put ice in a plastic bag.  Place a towel between your skin and the bag.  Leave the ice on for 15-20 minutes, 03-04 times a day.  Do not use a donut-shaped pillow or sit on the toilet for a long time. GET HELP RIGHT AWAY IF:   You have more pain that is not controlled by treatment or medicine.  You have bleeding that will not stop.  You have trouble or are unable to poop (bowel movement).  You have pain or puffiness outside the area of the hemorrhoids. MAKE SURE YOU:   Understand these instructions.  Will watch your condition.  Will get help right away if you are not doing well or get worse. Document Released: 04/23/2008 Document Revised: 07/01/2012 Document Reviewed: 05/26/2012 [facility] Patient Information 2015 [place], [place]. This information is not intended to replace advice given to you by your health care provider. Make sure you discuss any questions you have with your health care provider.   Balmex cream to area Tucks wipes to clean Colace stool softener

## 2015-03-10 NOTE — Progress Notes (Signed)
Reviewed personally.  M. Suzanne Dajanay Northrup, MD.  

## 2015-03-10 NOTE — Addendum Note (Signed)
Addended by: Regina Eck on: 03/10/2015 05:01 PM   Modules accepted: Miquel Dunn

## 2015-03-10 NOTE — Progress Notes (Addendum)
54 y.o. G0P0000 Divorced  Caucasian Fe here for annual exam.  Menopausal no HRT. Denies vaginal bleeding or vaginal dryness. Occasional hot flashes and night sweats no insomnia issues. Patient has noted more lumps in her tissue, since last visit. Continues with Rheumatology follow up for RA. Patient's WBC has been low due to Plaquenil, has stopped now. Treating hemorrhoid with preparation H. With no change, some firm stools, but no bleeding or black tarry stools. Sees dermatology for skin follow up and PCP for aex/labs. Has noted some palpitations at night, no SOB or pain. Has decreased caffeine use and this has helped.  No other health issues today.  Patient's last menstrual period was 02/04/2014.          Sexually active: Yes.    The current method of family planning is post menopausal status.    Exercising: Yes.    Walking daily Smoker:  no  Health Maintenance: Pap:  03/04/14 Neg. HR HPV:neg MMG:  12/30/14 BIRADS1:neg Category C 3 D Self Breast Check: yes, sometimes  Colonoscopy:  Never BMD:  ~ 2012 per pt - Normal  TDaP:  UTD Labs: RA Doctor   reports that she has never smoked. She has never used smokeless tobacco. She reports that she drinks about 1.5 oz of alcohol per week. She reports that she does not use illicit drugs.  Past Medical History  Diagnosis Date  . Abnormal uterine bleeding   . Fibroid   . Psoriatic arthritis     Past Surgical History  Procedure Laterality Date  . No past surgeries    . Dilatation & curettage/hysteroscopy with trueclear N/A 04/15/2014    Procedure: Hysteroscopy, Attempted DILATATION & CURETTAGE,  with attempted TRUCLEAR;  Surgeon: Azalia Bilis, MD;  Location: [facility];  Service: Gynecology;  Laterality: N/A;    Current Outpatient Prescriptions  Medication Sig Dispense Refill  . aspirin EC 81 MG tablet Take by mouth daily.    . B Complex Vitamins (VITAMIN B COMPLEX PO) Take by mouth daily.    . Calcium-Magnesium 100-50 MG TABS Take by mouth.    .  cholecalciferol (VITAMIN D) 1000 UNITS tablet Take 1,000 Units by mouth daily.    Marland Kitchen MAGNESIUM PO Take 1 tablet by mouth 3 (three) times a week.     . mometasone (NASONEX) 50 MCG/ACT nasal spray Place 2 sprays into the nose daily. 17 g 3  . Nutritional Supplements (JUICE PLUS FIBRE PO) Take by mouth daily.    . temazepam (RESTORIL) 15 MG capsule Take 15 mg by mouth at bedtime as needed.   0  . hydroxychloroquine (PLAQUENIL) 200 MG tablet Take 400 mg by mouth daily. Take two 200 mg tablets (400 mg total) daily     No current facility-administered medications for this visit.    Family History  Problem Relation Age of Onset  . Hypertension Mother   . Heart disease Mother   . Cancer Father     bladder  . Hypertension Father   . Heart disease Father   . Breast cancer Sister 72    ROS:  Pertinent items are noted in HPI.  Otherwise, a comprehensive ROS was negative.  Exam:   BP 100/60 mmHg  Pulse 72  Resp 18  Ht 5' 8.5" (1.74 m)  Wt 182 lb (82.555 kg)  BMI 27.27 kg/m2  LMP 02/04/2014 Height: 5' 8.5" (174 cm) Ht Readings from Last 3 Encounters:  03/10/15 5' 8.5" (1.74 m)  10/21/14 5' 8.5" (1.74 m)  04/29/14  5' 8.5" (1.74 m)    General appearance: alert, cooperative and appears stated age Head: Normocephalic, without obvious abnormality, atraumatic Neck: no adenopathy, supple, symmetrical, trachea midline and thyroid normal to inspection and palpation Lungs: clear to auscultation bilaterally Breasts: normal appearance, no masses or tenderness, No nipple retraction or dimpling, No nipple discharge or bleeding, No axillary or supraclavicular adenopathy Heart: regular rate and rhythm Abdomen: soft, non-tender; no masses,  no organomegaly Extremities: extremities normal, atraumatic, no cyanosis or edema Skin: Skin color, texture, turgor normal. No rashes or lesions Lymph nodes: Cervical, supraclavicular, and axillary nodes normal. No abnormal inguinal nodes palpated Neurologic:  Grossly normal   Pelvic: External genitalia:  no lesions              Urethra:  normal appearing urethra with no masses, tenderness or lesions              Bartholin's and Skene's: normal                 Vagina: normal appearing vagina with normal color and discharge, no lesions              Cervix: normal,non tender,no lesions              Pap taken: No. Bimanual Exam:  Uterus:  normal size, contour, position, consistency, mobility, non-tender              Adnexa: normal adnexa and no mass, fullness, tenderness               Rectovaginal: Confirms               Anus:  normal sphincter tone, no lesions  Chaperone present: Yes  A:  Well Woman with normal exam  Menopausal no HRT history of fibroids  RA with Rheumatology management  Dermatology management for Vitiligo  Hemorrhoid not thrombosed   P:   Reviewed health and wellness pertinent to exam  Discussed need to evaluate if vaginal bleeding.  Continue with MD as indicated  Discussed tucks pads for comfort and cleaning, starting on colace stool softener daily to avoid straining, Balmex to area for protection and comfort. Warning signs with hemorrhoid given and need to advise. Questions addressed.  Pap smear as above   counseled on breast self exam, mammography screening, adequate intake of calcium and vitamin D, diet and exercise  return annually or prn  An After Visit Summary was printed and given to the patient.

## 2015-07-31 IMAGING — CR DG LUMBAR SPINE COMPLETE 4+V
5 series · 5 of 5 positions shown · non-contrast
Comparison: None.

CLINICAL DATA: Fall 1 year ago. Progressive low back pain without
sciatica.

EXAM:
LUMBAR SPINE - COMPLETE 4+ VIEW

[view not recorded (1 of 5)]
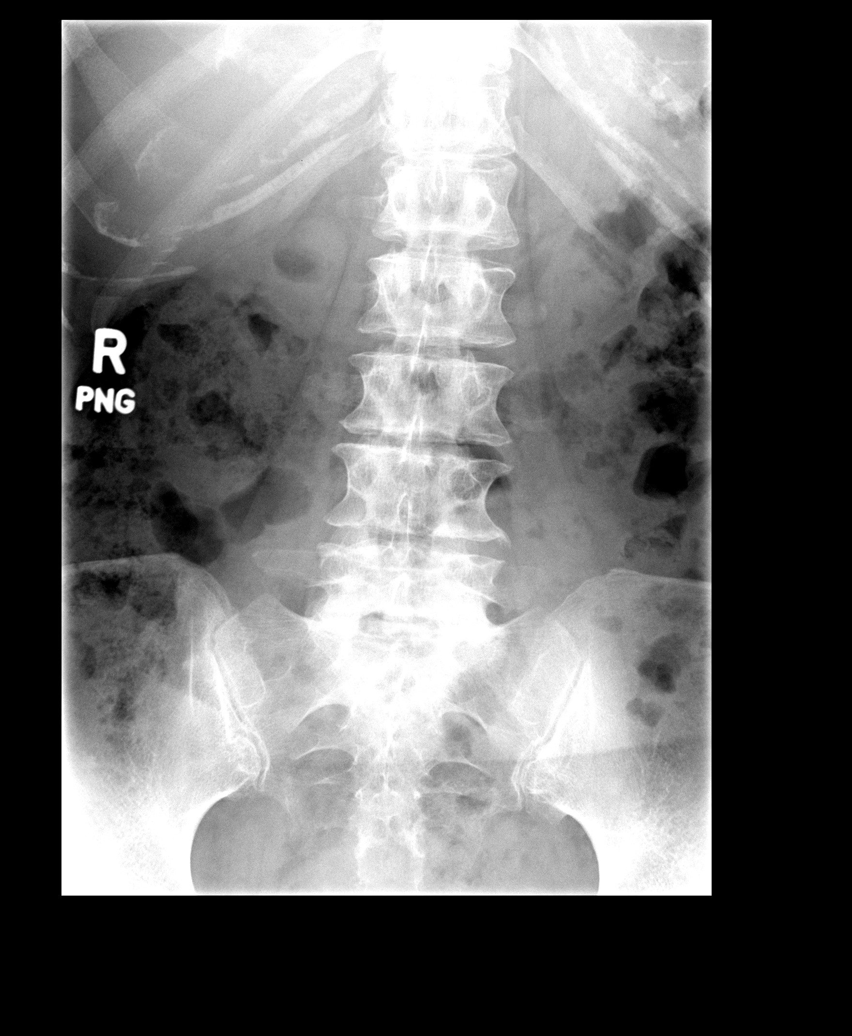

[view not recorded (2 of 5)]
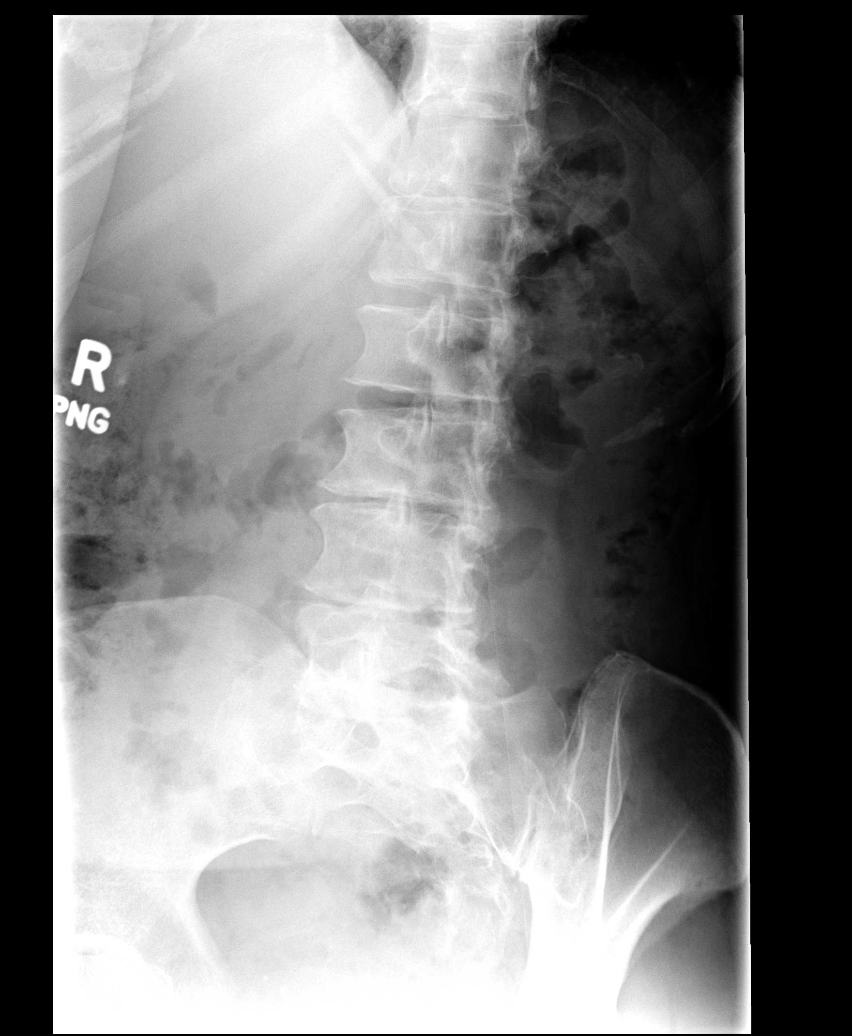

[view not recorded (3 of 5)]
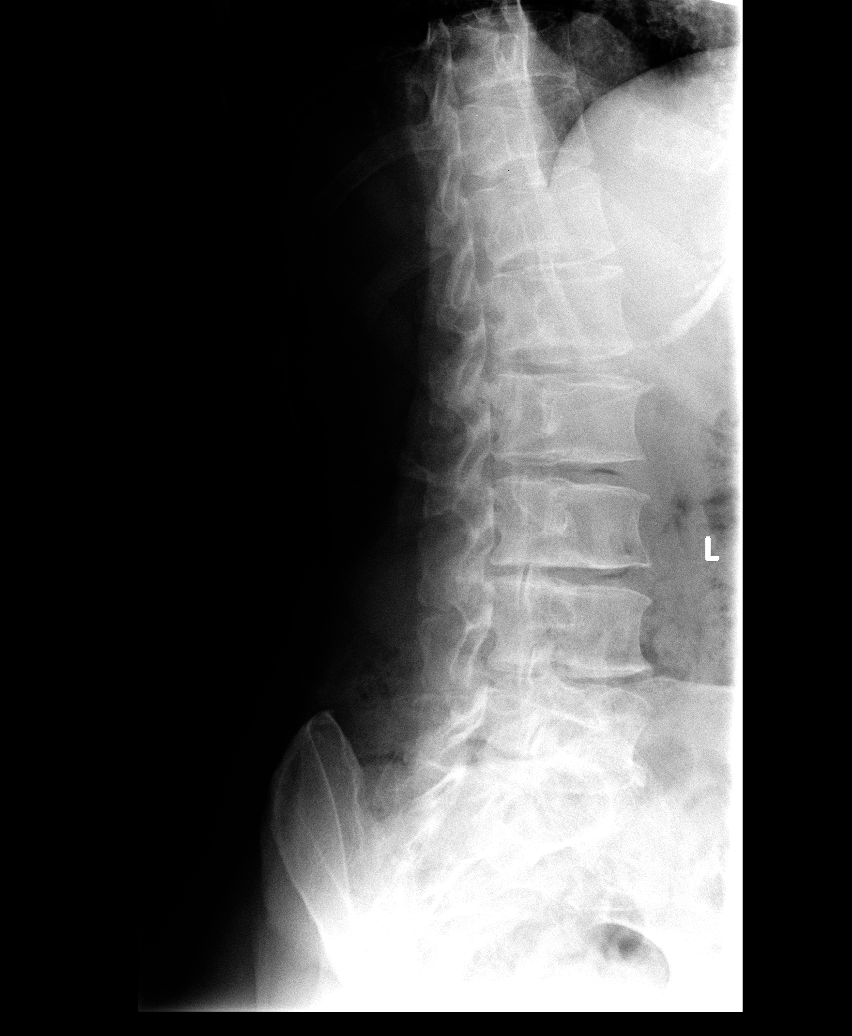

[view not recorded (4 of 5)]
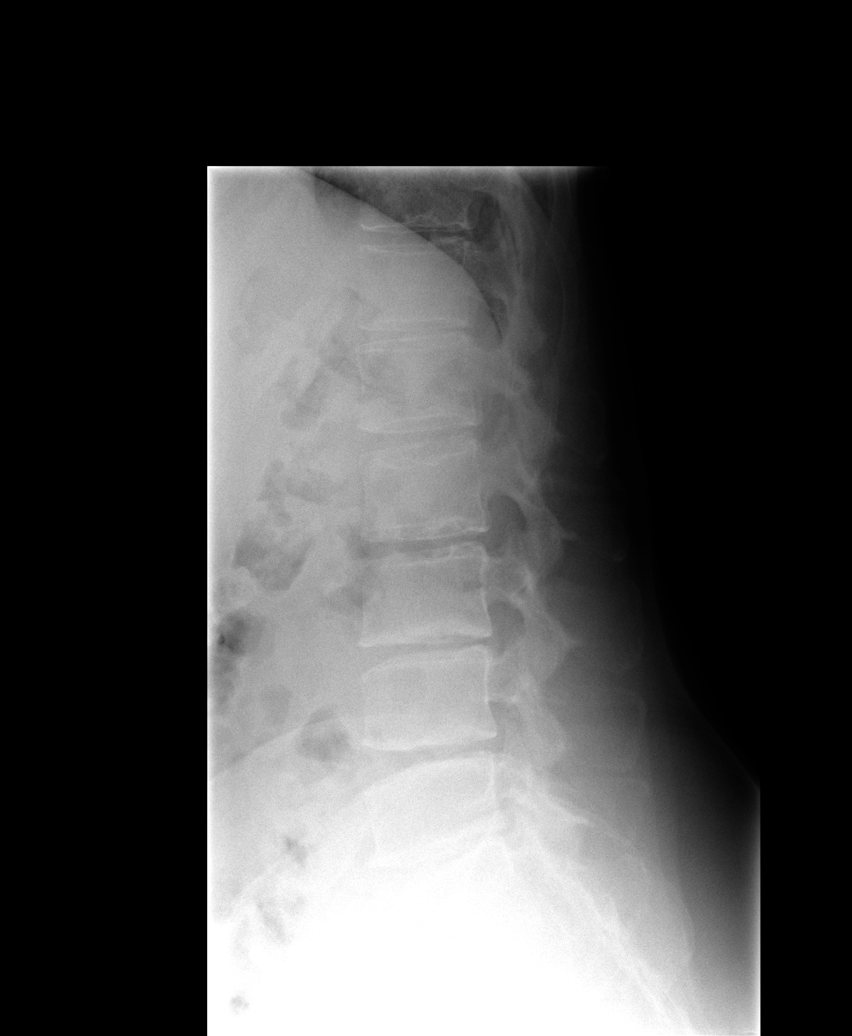

[view not recorded (5 of 5)]
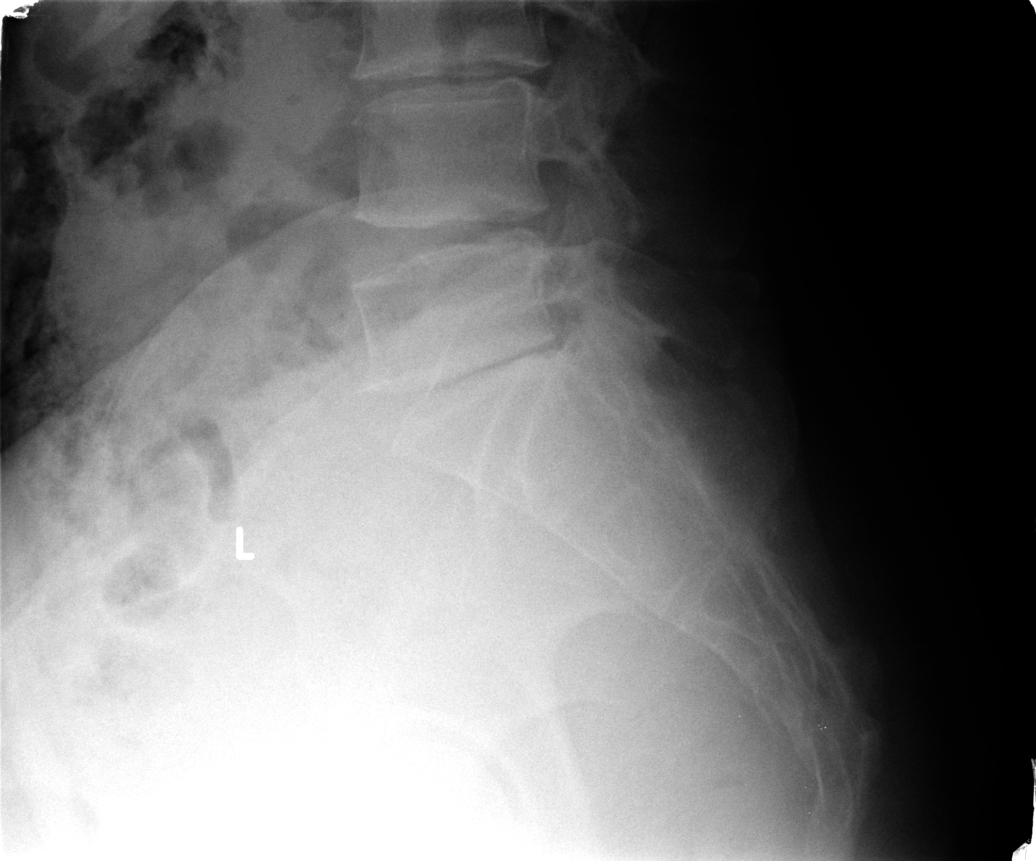

[5 of 5 positions shown; findings below may reference images not displayed]

FINDINGS: No fracture or spondylolisthesis. There is straightening of the
normal lumbar lordosis. Minor loss of disc height with minor
endplate irregularity at L1-L2 and L2-L3. Moderate loss of disc
height at L3-L4 through L5-S1.

Facet joints are well preserved.

Soft tissues are unremarkable.
IMPRESSION: 1. No fracture.  No spondylolisthesis.
2. Degenerative changes and straightening of the normal lumbar
lordosis as described.

## 2015-12-15 IMAGING — US US ABDOMEN COMPLETE
1 series · 14 of 25 positions shown · non-contrast
Comparison: None.

CLINICAL DATA: Thrombus cytopenia

EXAM:
ULTRASOUND ABDOMEN COMPLETE

[Series 1: us abdomen complete · 0.20mm/px · 14 of 81 slices shown]
[im 1/81]
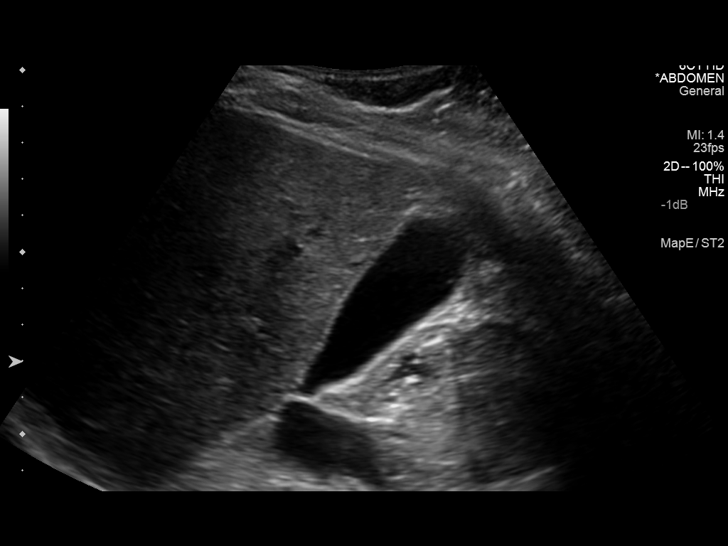
[im 7/81]
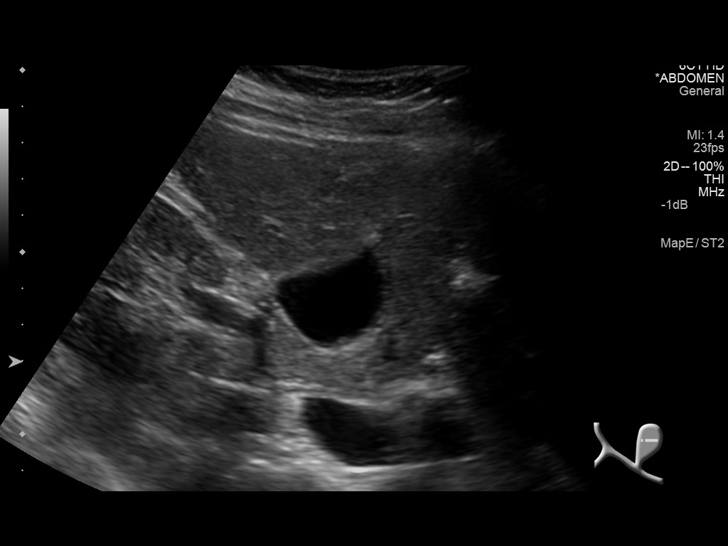
[im 14/81]
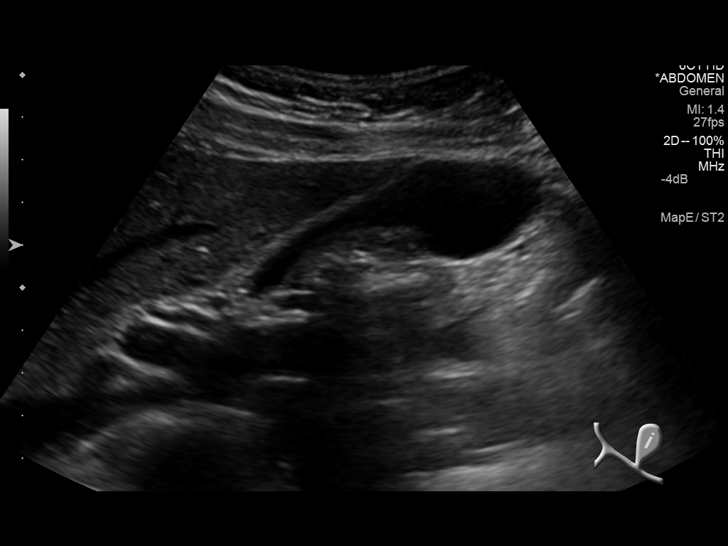
[im 21/81]
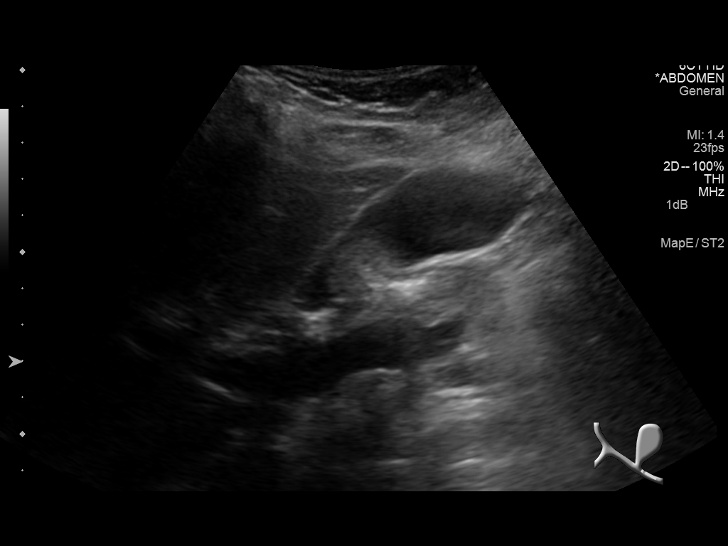
[im 27/81]
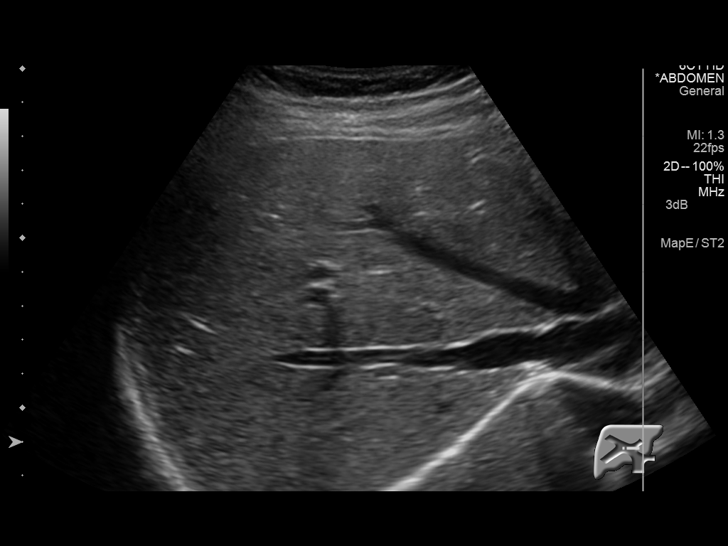
[im 31/81]
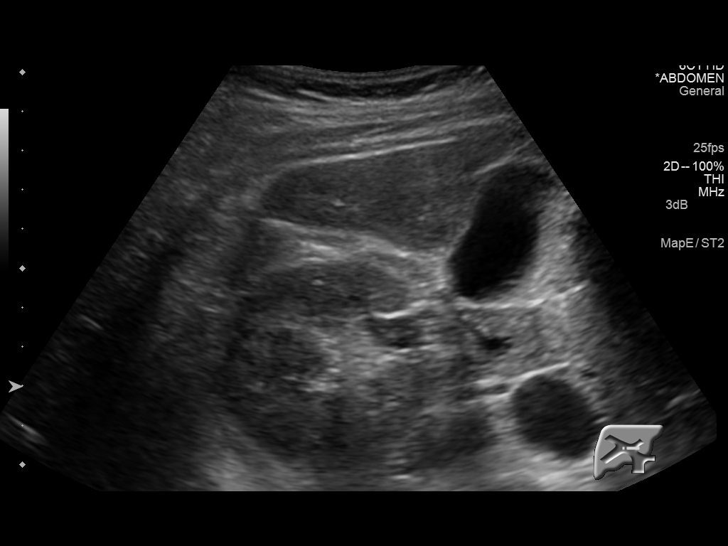
[im 37/81]
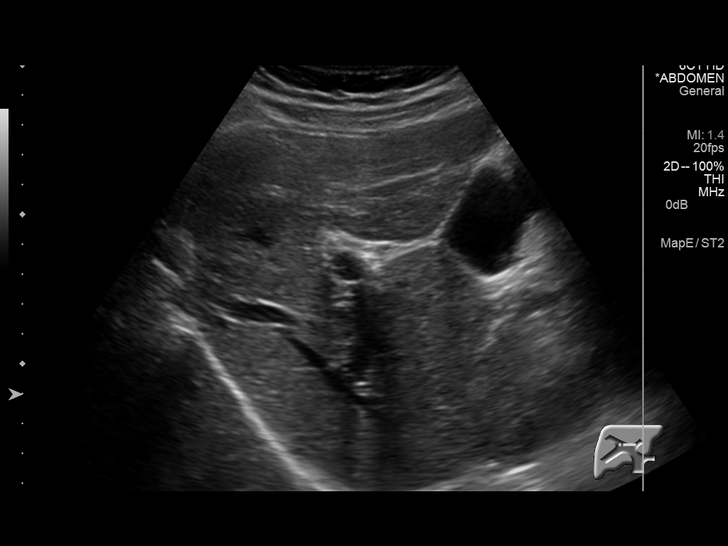
[im 44/81]
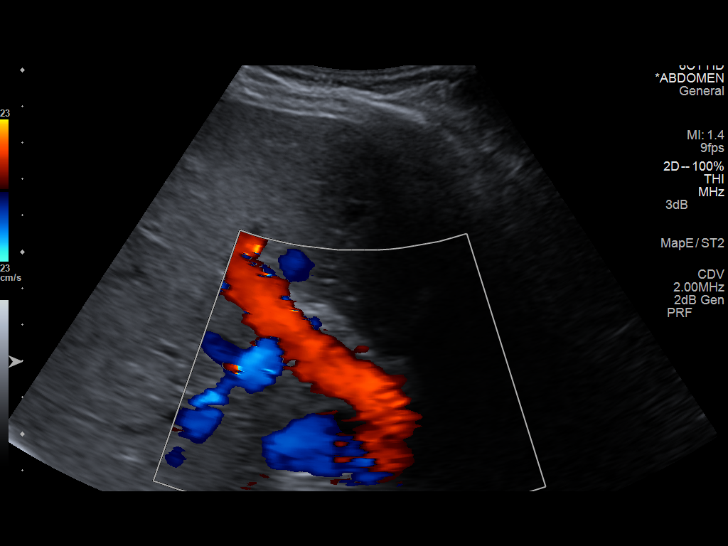
[im 51/81]
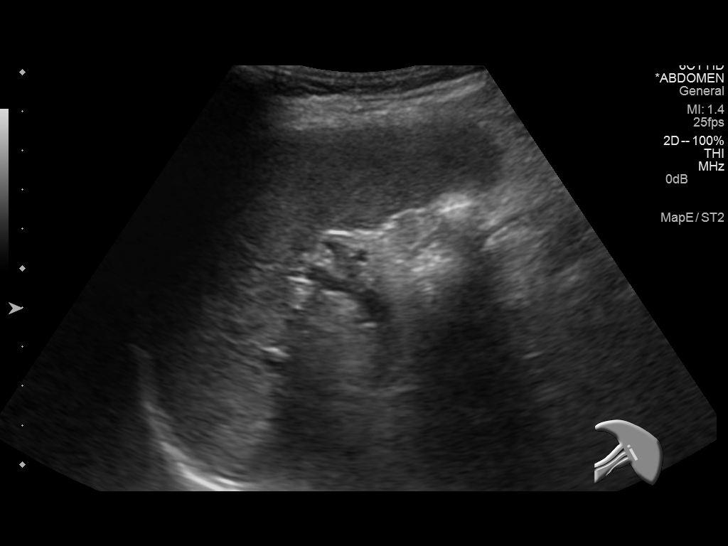
[im 54/81]
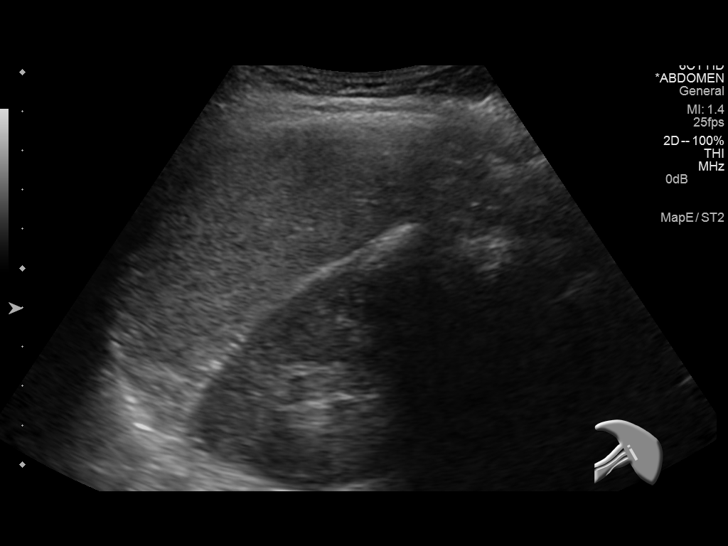
[im 61/81]
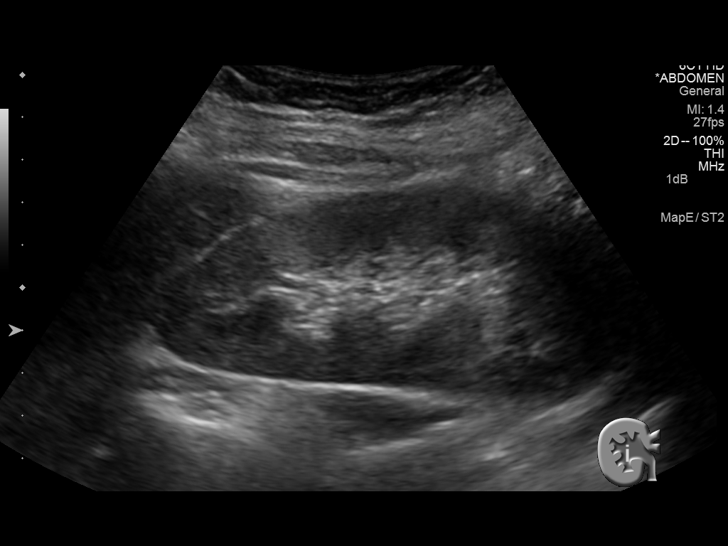
[im 67/81]
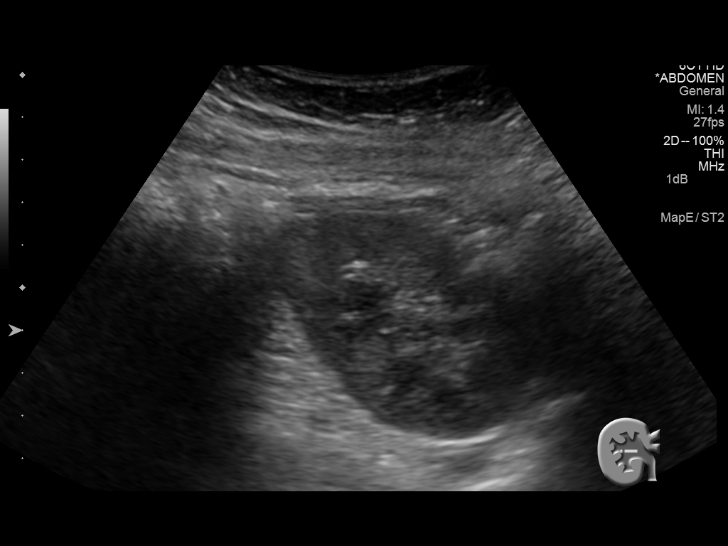
[im 74/81]
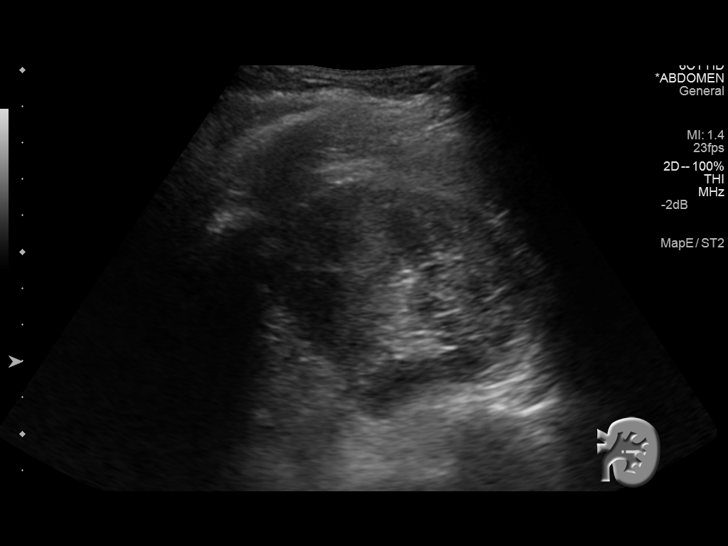
[im 81/81]
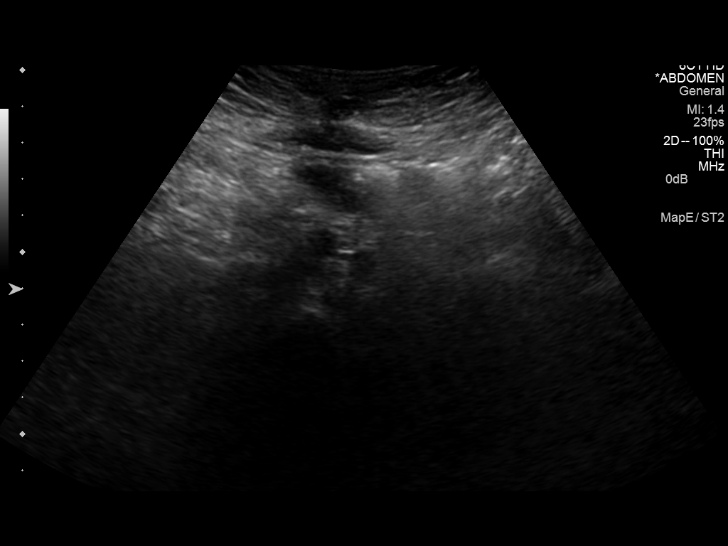

[14 of 25 positions shown; findings below may reference images not displayed]

FINDINGS: Gallbladder: No gallstones or wall thickening visualized. No
sonographic Murphy sign noted.

Common bile duct: Diameter: 3.7 mm

Liver: The liver is normal in echotexture and contour. There is no
focal mass or ductal dilation.

IVC: No abnormality visualized.

Pancreas: Visualized portion unremarkable.

Spleen: The spleen measures 8.8 cm longitudinally by 9.1 cm AP by
9.8 cm transversely and has a calculated volume of 392 cc which is
normal. Its echotexture is normal.

Right Kidney: Length: 11.6 cm. Echogenicity within normal limits. No
mass or hydronephrosis visualized.

Left Kidney: Length: 12.0 cm. Echogenicity within normal limits. No
mass or hydronephrosis visualized.

Abdominal aorta: No aneurysm visualized.

Other findings: There is no ascites.
IMPRESSION: Normal abdominal ultrasound.  The liver and spleen are unremarkable.

## 2016-03-15 ENCOUNTER — Ambulatory Visit: Payer: Managed Care, Other (non HMO) | Admitting: Certified Nurse Midwife

## 2016-04-04 ENCOUNTER — Ambulatory Visit: Payer: Managed Care, Other (non HMO) | Admitting: Certified Nurse Midwife

## 2016-04-16 ENCOUNTER — Ambulatory Visit: Payer: Managed Care, Other (non HMO) | Admitting: Family Medicine

## 2016-04-17 ENCOUNTER — Ambulatory Visit (INDEPENDENT_AMBULATORY_CARE_PROVIDER_SITE_OTHER): Payer: No Typology Code available for payment source | Admitting: Family Medicine

## 2016-04-17 VITALS — BP 110/80 | HR 100 | Temp 98.0°F | Ht 68.5 in | Wt 188.3 lb

## 2016-04-17 DIAGNOSIS — D696 Thrombocytopenia, unspecified: Secondary | ICD-10-CM

## 2016-04-17 DIAGNOSIS — Z01818 Encounter for other preprocedural examination: Secondary | ICD-10-CM

## 2016-04-17 NOTE — Progress Notes (Signed)
Subjective:     Patient ID: Brittney Gallegos, female   DOB: 07/30/1960, 55 y.o.   MRN: [id number]  HPI Patient is here for preoperative clearance for liposuction surgery She is looking at a specific type of liposuction surgery which is done in [place]. The proposed date for that is October 17. She apparently needs preoperative labs including CBC, comprehensive metabolic panel, PT, PTT, serum iron, and EKG. She has no cardiac history. Takes no regular prescription medications. No chronic lung disease. Apparently surgery will be done under local anesthetic and she'll be fully conscious. She does have history of chronic mild thrombocytopenia with previous platelet counts around 100,000.  Denies any recent bleeding concerns or easy bruising or petechiae.    Past Medical History:  Diagnosis Date  . Abnormal uterine bleeding   . Fibroid   . Psoriatic arthritis    Past Surgical History:  Procedure Laterality Date  . DILATATION & CURETTAGE/HYSTEROSCOPY WITH TRUECLEAR N/A 04/15/2014   Procedure: Hysteroscopy, Attempted DILATATION & CURETTAGE,  with attempted TRUCLEAR;  Surgeon: Azalia Bilis, MD;  Location: [facility];  Service: Gynecology;  Laterality: N/A;  . NO PAST SURGERIES      reports that she has never smoked. She has never used smokeless tobacco. She reports that she drinks about 1.5 oz of alcohol per week . She reports that she does not use drugs. family history includes Breast cancer (age of onset: 45) in her sister; Cancer in her father; Heart disease in her father and mother; Hypertension in her father and mother. No Known Allergies   Review of Systems  Constitutional: Negative for fatigue.  Eyes: Negative for visual disturbance.  Respiratory: Negative for cough, chest tightness, shortness of breath and wheezing.   Cardiovascular: Negative for chest pain, palpitations and leg swelling.  Neurological: Negative for dizziness, seizures, syncope, weakness, light-headedness and  headaches.       Objective:   Physical Exam  Constitutional: She appears well-developed and well-nourished.  HENT:  Mouth/Throat: Oropharynx is clear and moist.  Eyes: Pupils are equal, round, and reactive to light.  Neck: Neck supple. No JVD present. No thyromegaly present.  Cardiovascular: Normal rate and regular rhythm.  Exam reveals no gallop.   Pulmonary/Chest: Effort normal and breath sounds normal. No respiratory distress. She has no wheezes. She has no rales.  Abdominal: Soft. She exhibits no mass. There is no tenderness. There is no rebound and no guarding.  Musculoskeletal: She exhibits no edema.  Neurological: She is alert.  Skin: No rash noted.       Assessment:     Preoperative clearance. Patient has no history of any chronic heart or lung disease and nonfocal exam.  EKG shows NSR with no acute concerns.    Plan:     -Obtain EKG. -She wishes to check with insurance regarding whether they will cover labs but she will need CBC, comprehensive metabolic panel, PT, PTT, serum iron  Eulas Post MD [facility]

## 2016-04-17 NOTE — Patient Instructions (Signed)
-  Check on insurance coverage for labs -Let us know whether you wish to get them here or elsewhere (eg [facility]) and we can put in orders

## 2016-04-18 ENCOUNTER — Telehealth: Payer: Self-pay | Admitting: Family Medicine

## 2016-04-18 NOTE — Telephone Encounter (Signed)
Pt seen yesterday and has decided to have her labs done at [facility] due to insurance preferences.  Pt would like Dr Elease Hashimoto to fax the lab order to: [phone]  CBC PTT PT CMB Serum iron  Pt would like to go on Friday afternoon   Pt would like for her info to be sent to Dr Polly Cobia office on visit results.  Fax: [phone]

## 2016-04-19 ENCOUNTER — Other Ambulatory Visit: Payer: Self-pay | Admitting: Family Medicine

## 2016-04-19 ENCOUNTER — Other Ambulatory Visit: Payer: Self-pay

## 2016-04-19 LAB — CBC WITH DIFFERENTIAL/PLATELET
BASOS ABS: 53 {cells}/uL (ref 0–200)
BASOS PCT: 1 %
EOS ABS: 159 {cells}/uL (ref 15–500)
Eosinophils Relative: 3 %
HCT: 40.5 % (ref 35.0–45.0)
Hemoglobin: 13.7 g/dL (ref 11.7–15.5)
LYMPHS PCT: 29 %
Lymphs Abs: 1537 cells/uL (ref 850–3900)
MCH: 31.9 pg (ref 27.0–33.0)
MCHC: 33.8 g/dL (ref 32.0–36.0)
MCV: 94.2 fL (ref 80.0–100.0)
MPV: 13 fL — ABNORMAL HIGH (ref 7.5–12.5)
Monocytes Absolute: 371 cells/uL (ref 200–950)
Monocytes Relative: 7 %
Neutro Abs: 3180 cells/uL (ref 1500–7800)
Neutrophils Relative %: 60 %
Platelets: 149 10*3/uL (ref 140–400)
RBC: 4.3 MIL/uL (ref 3.80–5.10)
RDW: 12.9 % (ref 11.0–15.0)
WBC: 5.3 10*3/uL (ref 3.8–10.8)

## 2016-04-19 NOTE — Telephone Encounter (Signed)
Orders written on a RX pad for pt to pick up to take to the lab. She is aware via voicemail of this.

## 2016-04-20 LAB — COMPREHENSIVE METABOLIC PANEL
ALBUMIN: 4.4 g/dL (ref 3.6–5.1)
ALK PHOS: 74 U/L (ref 33–130)
ALT: 13 U/L (ref 6–29)
AST: 19 U/L (ref 10–35)
BUN: 19 mg/dL (ref 7–25)
CALCIUM: 9 mg/dL (ref 8.6–10.4)
CO2: 29 mmol/L (ref 20–31)
Chloride: 100 mmol/L (ref 98–110)
Creat: 0.8 mg/dL (ref 0.50–1.05)
Glucose, Bld: 59 mg/dL — ABNORMAL LOW (ref 65–99)
POTASSIUM: 3.5 mmol/L (ref 3.5–5.3)
Sodium: 142 mmol/L (ref 135–146)
TOTAL PROTEIN: 7 g/dL (ref 6.1–8.1)
Total Bilirubin: 0.6 mg/dL (ref 0.2–1.2)

## 2016-04-20 LAB — APTT: aPTT: 28 s (ref 22–34)

## 2016-04-20 LAB — PROTIME-INR
INR: 1
Prothrombin Time: 10.7 s (ref 9.0–11.5)

## 2016-04-20 LAB — IRON: IRON: 77 ug/dL (ref 45–160)

## 2016-04-28 HISTORY — PX: LIPOSUCTION: SHX10

## 2016-06-07 ENCOUNTER — Ambulatory Visit: Payer: Managed Care, Other (non HMO) | Admitting: Certified Nurse Midwife

## 2016-08-20 ENCOUNTER — Ambulatory Visit: Payer: Managed Care, Other (non HMO) | Admitting: Certified Nurse Midwife

## 2016-09-25 ENCOUNTER — Ambulatory Visit (INDEPENDENT_AMBULATORY_CARE_PROVIDER_SITE_OTHER): Payer: No Typology Code available for payment source | Admitting: Certified Nurse Midwife

## 2016-09-25 ENCOUNTER — Encounter: Payer: Self-pay | Admitting: Certified Nurse Midwife

## 2016-09-25 VITALS — BP 110/70 | HR 70 | Resp 16 | Ht 68.5 in | Wt 170.0 lb

## 2016-09-25 DIAGNOSIS — Z01419 Encounter for gynecological examination (general) (routine) without abnormal findings: Secondary | ICD-10-CM

## 2016-09-25 DIAGNOSIS — Z124 Encounter for screening for malignant neoplasm of cervix: Secondary | ICD-10-CM | POA: Diagnosis not present

## 2016-09-25 DIAGNOSIS — N6310 Unspecified lump in the right breast, unspecified quadrant: Secondary | ICD-10-CM | POA: Diagnosis not present

## 2016-09-25 DIAGNOSIS — Z713 Dietary counseling and surveillance: Secondary | ICD-10-CM

## 2016-09-25 NOTE — Progress Notes (Addendum)
55 y.o. G0P0000 Divorced  Caucasian Fe here for annual exam. Menopausal no HRT. Denies vaginal bleeding or vaginal dryness except slight with sexual activity. Ex Spouse back in the picture now. No concerns with STD exposure or testing desired. Had labs done in 9/17 for Lipo suction surgery, all normal per epic. Sees Dr Elease Hashimoto prn. No health issues today..  Patient's last menstrual period was 02/04/2014.          Sexually active: Yes.    The current method of family planning is post menopausal status.    Exercising: Yes.    walking Smoker:  no  Health Maintenance: Pap:  03-04-14 neg HPV HR neg MMG: 12-30-14 category c density birads 1:neg Colonoscopy: none BMD:   ~2012 per patient normal TDaP:  Unsure but feel she is UTD Shingles: no Pneumonia: no Hep C and HIV: not done Labs: none Self breast exam: done occ   reports that she has never smoked. She has never used smokeless tobacco. She reports that she drinks about 1.5 oz of alcohol per week . She reports that she does not use drugs.  Past Medical History:  Diagnosis Date  . Abnormal uterine bleeding   . Fibroid   . Psoriatic arthritis [facility])     Past Surgical History:  Procedure Laterality Date  . DILATATION & CURETTAGE/HYSTEROSCOPY WITH TRUECLEAR N/A 04/15/2014   Procedure: Hysteroscopy, Attempted DILATATION & CURETTAGE,  with attempted TRUCLEAR;  Surgeon: Azalia Bilis, MD;  Location: [facility];  Service: Gynecology;  Laterality: N/A;  . NO PAST SURGERIES      Current Outpatient Prescriptions  Medication Sig Dispense Refill  . B Complex Vitamins (VITAMIN B COMPLEX PO) Take by mouth daily.    . Cholecalciferol (VITAMIN D) 2000 units CAPS Take 2,000 Units by mouth.    . Nutritional Supplements (JUICE PLUS FIBRE PO) Take by mouth daily.     No current facility-administered medications for this visit.     Family History  Problem Relation Age of Onset  . Hypertension Mother   . Heart disease Mother   . Cancer Father    bladder  . Hypertension Father   . Heart disease Father   . Breast cancer Sister 25    ROS:  Pertinent items are noted in HPI.  Otherwise, a comprehensive ROS was negative.  Exam:   BP 110/70   Pulse 70   Resp 16   Ht 5' 8.5" (1.74 m)   Wt 170 lb (77.1 kg)   LMP 02/04/2014   BMI 25.47 kg/m   Height: 5' 8.5" (174 cm) Ht Readings from Last 3 Encounters:  09/25/16 5' 8.5" (1.74 m)  04/17/16 5' 8.5" (1.74 m)  03/10/15 5' 8.5" (1.74 m)    General appearance: alert, cooperative and appears stated age Head: Normocephalic, without obvious abnormality, atraumatic Neck: no adenopathy, supple, symmetrical, trachea midline and thyroid normal to inspection and palpation Lungs: clear to auscultation bilaterally Breasts: normal appearance, no masses or tenderness, No nipple retraction or dimpling, No nipple discharge or bleeding, No axillary or supraclavicular adenopathy, tenderness. Right  Breast mass noted at 3 o'clock at 2 fb from breast edge, small 1 cm, mobile Heart: regular rate and rhythm Abdomen: soft, non-tender; no masses,  no organomegaly Extremities: extremities normal, atraumatic, no cyanosis or edema Skin: Skin color, texture, turgor normal. No rashes or lesions Lymph nodes: Cervical, supraclavicular, and axillary nodes normal. No abnormal inguinal nodes palpated Neurologic: Grossly normal   Pelvic: External genitalia:  no lesions, loss of  pigmentation in entire vulva area, known, no change              Urethra:  normal appearing urethra with no masses, tenderness or lesions              Bartholin's and Skene's: normal                 Vagina: normal appearing vagina with normal color and discharge, no lesions              Cervix: multiparous appearance, nabothian cyst and no cervical motion tenderness              Pap taken: Yes.   Bimanual Exam:  Uterus:  normal size, contour, position, consistency, mobility, non-tender              Adnexa: normal adnexa and no mass,  fullness, tenderness               Rectovaginal: Confirms               Anus:  normal sphincter tone, no lesions  Chaperone present: yes  A:  Well Woman with normal exam  Menopausal no HRT  Occasional vaginal dryness  Right breast mass  Family history of breast cancer sister age 110  P:   Reviewed health and wellness pertinent to exam   Aware of need for evaluation if vaginal bleeding  Discussed OTC coconut oil with instructions for prn use. Questions addressed.  Discussed finding and need for evaluation with diagnostic mammogram and [place]. Patient agreeable. Will be scheduled prior to leaving today. Questions addressed.  Declines screening labs  Pap smear as above with HPV reflex   counseled on breast self exam, mammography screening, adequate intake of calcium and vitamin D, diet and exercise  return annually or prn  An After Visit Summary was printed and given to the patient.

## 2016-09-25 NOTE — Patient Instructions (Signed)

## 2016-09-25 NOTE — Progress Notes (Signed)
Encounter reviewed Brittney Willenbring, MD   

## 2016-09-27 LAB — IPS PAP TEST WITH REFLEX TO HPV

## 2016-12-12 DIAGNOSIS — R922 Inconclusive mammogram: Secondary | ICD-10-CM | POA: Diagnosis not present

## 2016-12-19 ENCOUNTER — Telehealth: Payer: Self-pay

## 2016-12-19 NOTE — Telephone Encounter (Signed)
Spoke with patient. Advised patient of Melvia Heaps CNM recommendation to be seen for 6 week breast recheck following breast imaging with [facility] on 12/12/2016. Patient declines. Advised of importance of breast recheck as negative imaging does not always mean the area is not cancerous. Patient again declines recheck stating her breast are "dense" and "always have lumps."   Routing to Melvia Heaps CNM for review.

## 2016-12-24 NOTE — Telephone Encounter (Signed)
Please let patient know that we need to check to make sure area is still not present. Mammogram and [place] are helpful, but if still present would need further evaluation.

## 2016-12-25 NOTE — Telephone Encounter (Signed)
Left detailed message at number provided [phone], okay per ROI. Advised of message as seen below from [place] and requested return call to the office to schedule recheck.

## 2016-12-30 NOTE — Telephone Encounter (Signed)
Melvia Heaps CNM and requested return call to the office to schedule recheck.

## 2017-01-06 NOTE — Telephone Encounter (Signed)
Melvia Heaps CNM I have attempted to reach this patient x 2 without return call. Please advise.

## 2017-01-09 DIAGNOSIS — L80 Vitiligo: Secondary | ICD-10-CM | POA: Diagnosis not present

## 2017-01-09 DIAGNOSIS — D2372 Other benign neoplasm of skin of left lower limb, including hip: Secondary | ICD-10-CM | POA: Diagnosis not present

## 2017-01-09 DIAGNOSIS — D239 Other benign neoplasm of skin, unspecified: Secondary | ICD-10-CM | POA: Diagnosis not present

## 2017-01-09 DIAGNOSIS — L820 Inflamed seborrheic keratosis: Secondary | ICD-10-CM | POA: Diagnosis not present

## 2017-01-09 DIAGNOSIS — L814 Other melanin hyperpigmentation: Secondary | ICD-10-CM | POA: Diagnosis not present

## 2017-01-09 DIAGNOSIS — L821 Other seborrheic keratosis: Secondary | ICD-10-CM | POA: Diagnosis not present

## 2017-01-10 DIAGNOSIS — L821 Other seborrheic keratosis: Secondary | ICD-10-CM | POA: Diagnosis not present

## 2017-01-10 DIAGNOSIS — D2272 Melanocytic nevi of left lower limb, including hip: Secondary | ICD-10-CM | POA: Diagnosis not present

## 2017-01-20 ENCOUNTER — Telehealth: Payer: Self-pay | Admitting: Family Medicine

## 2017-01-20 DIAGNOSIS — L4050 Arthropathic psoriasis, unspecified: Secondary | ICD-10-CM

## 2017-01-20 NOTE — Telephone Encounter (Signed)
OK 

## 2017-01-20 NOTE — Telephone Encounter (Signed)
Okay to refer? 

## 2017-01-20 NOTE — Telephone Encounter (Signed)
Pt states she was seeing a rheumatologist in [place] and prefers to see one in [place]. However, pt needs a referral.  Dr Gavin Pound [facility] [address] [place], [place] [address]  [phone] Fax: [phone]

## 2017-01-21 NOTE — Telephone Encounter (Signed)
referral placed

## 2017-01-23 NOTE — Telephone Encounter (Signed)
I think letter is order and then close encounter

## 2017-01-24 NOTE — Telephone Encounter (Signed)
Letter written and to Deborah Leonard CNM for review.   

## 2017-01-24 NOTE — Telephone Encounter (Signed)
Certified letter mailed to home address on file.  Routing to provider for final review. Patient agreeable to disposition. Will close encounter.

## 2017-01-31 DIAGNOSIS — H2513 Age-related nuclear cataract, bilateral: Secondary | ICD-10-CM | POA: Diagnosis not present

## 2017-01-31 DIAGNOSIS — H35031 Hypertensive retinopathy, right eye: Secondary | ICD-10-CM | POA: Diagnosis not present

## 2017-01-31 DIAGNOSIS — H35033 Hypertensive retinopathy, bilateral: Secondary | ICD-10-CM | POA: Diagnosis not present

## 2017-01-31 DIAGNOSIS — H25013 Cortical age-related cataract, bilateral: Secondary | ICD-10-CM | POA: Diagnosis not present

## 2017-01-31 DIAGNOSIS — H35032 Hypertensive retinopathy, left eye: Secondary | ICD-10-CM | POA: Diagnosis not present

## 2017-01-31 DIAGNOSIS — H5319 Other subjective visual disturbances: Secondary | ICD-10-CM | POA: Diagnosis not present

## 2017-02-24 ENCOUNTER — Encounter: Payer: Self-pay | Admitting: Certified Nurse Midwife

## 2017-03-04 DIAGNOSIS — M545 Low back pain: Secondary | ICD-10-CM | POA: Diagnosis not present

## 2017-03-04 DIAGNOSIS — M2550 Pain in unspecified joint: Secondary | ICD-10-CM | POA: Diagnosis not present

## 2017-03-04 DIAGNOSIS — M542 Cervicalgia: Secondary | ICD-10-CM | POA: Diagnosis not present

## 2017-03-04 DIAGNOSIS — R5383 Other fatigue: Secondary | ICD-10-CM | POA: Diagnosis not present

## 2017-04-17 ENCOUNTER — Encounter: Payer: Self-pay | Admitting: Family Medicine

## 2017-09-03 ENCOUNTER — Ambulatory Visit: Payer: BLUE CROSS/BLUE SHIELD | Admitting: Family Medicine

## 2017-10-22 ENCOUNTER — Encounter: Payer: Self-pay | Admitting: Family Medicine

## 2017-10-22 ENCOUNTER — Ambulatory Visit: Payer: BLUE CROSS/BLUE SHIELD | Admitting: Family Medicine

## 2017-10-22 VITALS — BP 120/80 | HR 73 | Temp 97.9°F | Wt 170.0 lb

## 2017-10-22 DIAGNOSIS — J069 Acute upper respiratory infection, unspecified: Secondary | ICD-10-CM | POA: Diagnosis not present

## 2017-10-22 MED ORDER — AMOXICILLIN-POT CLAVULANATE 875-125 MG PO TABS: 1.0000 | ORAL_TABLET | Freq: Two times a day (BID) | ORAL | 0 refills | Status: DC

## 2017-10-22 NOTE — Patient Instructions (Signed)
Upper Respiratory Infection, Adult Most upper respiratory infections (URIs) are a viral infection of the air passages leading to the lungs. A URI affects the nose, throat, and upper air passages. The most common type of URI is nasopharyngitis and is typically referred to as "the common cold." URIs run their course and usually go away on their own. Most of the time, a URI does not require medical attention, but sometimes a bacterial infection in the upper airways can follow a viral infection. This is called a secondary infection. Sinus and middle ear infections are common types of secondary upper respiratory infections. Bacterial pneumonia can also complicate a URI. A URI can worsen asthma and chronic obstructive pulmonary disease (COPD). Sometimes, these complications can require emergency medical care and may be life threatening. What are the causes? Almost all URIs are caused by viruses. A virus is a type of germ and can spread from one person to another. What increases the risk? You may be at risk for a URI if:  You smoke.  You have chronic heart or lung disease.  You have a weakened defense (immune) system.  You are very young or very old.  You have nasal allergies or asthma.  You work in crowded or poorly ventilated areas.  You work in health care facilities or schools.  What are the signs or symptoms? Symptoms typically develop 2-3 days after you come in contact with a cold virus. Most viral URIs last 7-10 days. However, viral URIs from the influenza virus (flu virus) can last 14-18 days and are typically more severe. Symptoms may include:  Runny or stuffy (congested) nose.  Sneezing.  Cough.  Sore throat.  Headache.  Fatigue.  Fever.  Loss of appetite.  Pain in your forehead, behind your eyes, and over your cheekbones (sinus pain).  Muscle aches.  How is this diagnosed? Your health care provider may diagnose a URI by:  Physical exam.  Tests to check that your  symptoms are not due to another condition such as: ? Strep throat. ? Sinusitis. ? Pneumonia. ? Asthma.  How is this treated? A URI goes away on its own with time. It cannot be cured with medicines, but medicines may be prescribed or recommended to relieve symptoms. Medicines may help:  Reduce your fever.  Reduce your cough.  Relieve nasal congestion.  Follow these instructions at home:  Take medicines only as directed by your health care provider.  Gargle warm saltwater or take cough drops to comfort your throat as directed by your health care provider.  Use a warm mist humidifier or inhale steam from a shower to increase air moisture. This may make it easier to breathe.  Drink enough fluid to keep your urine clear or pale yellow.  Eat soups and other clear broths and maintain good nutrition.  Rest as needed.  Return to work when your temperature has returned to normal or as your health care provider advises. You may need to stay home longer to avoid infecting others. You can also use a face mask and careful hand washing to prevent spread of the virus.  Increase the usage of your inhaler if you have asthma.  Do not use any tobacco products, including cigarettes, chewing tobacco, or electronic cigarettes. If you need help quitting, ask your health care provider. How is this prevented? The best way to protect yourself from getting a cold is to practice good hygiene.  Avoid oral or hand contact with people with cold symptoms.  Wash your   hands often if contact occurs.  There is no clear evidence that vitamin C, vitamin E, echinacea, or exercise reduces the chance of developing a cold. However, it is always recommended to get plenty of rest, exercise, and practice good nutrition. Contact a health care provider if:  You are getting worse rather than better.  Your symptoms are not controlled by medicine.  You have chills.  You have worsening shortness of breath.  You have  brown or red mucus.  You have yellow or brown nasal discharge.  You have pain in your face, especially when you bend forward.  You have a fever.  You have swollen neck glands.  You have pain while swallowing.  You have white areas in the back of your throat. Get help right away if:  You have severe or persistent: ? Headache. ? Ear pain. ? Sinus pain. ? Chest pain.  You have chronic lung disease and any of the following: ? Wheezing. ? Prolonged cough. ? Coughing up blood. ? A change in your usual mucus.  You have a stiff neck.  You have changes in your: ? Vision. ? Hearing. ? Thinking. ? Mood. This information is not intended to replace advice given to you by your health care provider. Make sure you discuss any questions you have with your health care provider. Document Released: 01/08/2001 Document Revised: 03/17/2016 Document Reviewed: 10/20/2013 Elsevier Interactive Patient Education  2018 [employer]  

## 2017-10-22 NOTE — Progress Notes (Signed)
Subjective:     Patient ID: Brittney Gallegos, female   DOB: July 11, 1961, 57 y.o.   MRN: [id number]  HPI Patient seen for acute visit. Onset just yesterday of some nasal congestion and sinus pressure. She is concerned because of past history of sinusitis. Denies any sore throat. No body aches. No fever. No cough. She tried some Claritin without much relief. Her sister had similar symptoms a week ago.  Past Medical History:  Diagnosis Date  . Abnormal uterine bleeding   . Fibroid   . Psoriatic arthritis [facility])    Past Surgical History:  Procedure Laterality Date  . DILATATION & CURETTAGE/HYSTEROSCOPY WITH TRUECLEAR N/A 04/15/2014   Procedure: Hysteroscopy, Attempted DILATATION & CURETTAGE,  with attempted TRUCLEAR;  Surgeon: Azalia Bilis, MD;  Location: [facility];  Service: Gynecology;  Laterality: N/A;  . LIPOSUCTION  04/2016  . NO PAST SURGERIES      reports that she has never smoked. She has never used smokeless tobacco. She reports that she drinks about 1.5 oz of alcohol per week. She reports that she does not use drugs. family history includes Breast cancer (age of onset: 38) in her sister; Cancer in her father; Heart disease in her father and mother; Hypertension in her father and mother. No Known Allergies   Review of Systems  Constitutional: Negative for chills and fever.  HENT: Positive for congestion, rhinorrhea and sinus pressure. Negative for sore throat.   Respiratory: Negative for cough.        Objective:   Physical Exam  Constitutional: She appears well-developed and well-nourished.  HENT:  Right Ear: External ear normal.  Left Ear: External ear normal.  Mouth/Throat: Oropharynx is clear and moist.  Neck: Neck supple.  Cardiovascular: Normal rate and regular rhythm.  Pulmonary/Chest: Effort normal and breath sounds normal. No respiratory distress. She has no wheezes. She has no rales.  Lymphadenopathy:    She has no cervical adenopathy.       Assessment:      Probable viral URI    Plan:     -We have not recommended antibiotics at this point. Patient is very concerned because of past history sinusitis. We did agree to print prescription for Augmentin but only start if she has any worsening or persistent symptoms -Recommend supportive treatment with plenty fluids and rest  Eulas Post MD [facility] at [facility]'

## 2017-11-17 DIAGNOSIS — M19012 Primary osteoarthritis, left shoulder: Secondary | ICD-10-CM | POA: Diagnosis not present

## 2017-11-17 DIAGNOSIS — M7542 Impingement syndrome of left shoulder: Secondary | ICD-10-CM | POA: Insufficient documentation

## 2017-11-17 DIAGNOSIS — M25512 Pain in left shoulder: Secondary | ICD-10-CM | POA: Diagnosis not present

## 2017-11-20 DIAGNOSIS — D171 Benign lipomatous neoplasm of skin and subcutaneous tissue of trunk: Secondary | ICD-10-CM | POA: Diagnosis not present

## 2017-12-09 ENCOUNTER — Ambulatory Visit: Payer: BLUE CROSS/BLUE SHIELD | Admitting: Family Medicine

## 2017-12-09 ENCOUNTER — Encounter: Payer: Self-pay | Admitting: Family Medicine

## 2017-12-09 VITALS — BP 120/84 | HR 72 | Temp 98.0°F | Wt 176.5 lb

## 2017-12-09 DIAGNOSIS — J019 Acute sinusitis, unspecified: Secondary | ICD-10-CM

## 2017-12-09 MED ORDER — AMOXICILLIN-POT CLAVULANATE 875-125 MG PO TABS: 1.0000 | ORAL_TABLET | Freq: Two times a day (BID) | ORAL | 0 refills | Status: DC

## 2017-12-09 NOTE — Progress Notes (Signed)
  Subjective:     Patient ID: Brittney Gallegos, female   DOB: 1961-01-17, 57 y.o.   MRN: [id number]  HPI Patient seen for viral URI back in March. We printed prescription Augmentin at that time but told her not to fill unless symptoms worsened or persisted. Her symptoms did eventually resolve. Few weeks ago she developed some recurrent pansinusitis symptoms and she went ahead and filled prescription for Augmentin at that point. She states after just a few days she felt much better. She had some sore throat and facial pain and malaise. Then, she apparently missed place the antibiotic and was unable to complete the full course. She feels worse since stopping the medication a few days ago. She is requesting prescription for the full amount of this point. No fever. No chills. Symptoms are worse at night and early morning. Thick nasal discharge.  Past Medical History:  Diagnosis Date  . Abnormal uterine bleeding   . Fibroid   . Psoriatic arthritis [facility])    Past Surgical History:  Procedure Laterality Date  . DILATATION & CURETTAGE/HYSTEROSCOPY WITH TRUECLEAR N/A 04/15/2014   Procedure: Hysteroscopy, Attempted DILATATION & CURETTAGE,  with attempted TRUCLEAR;  Surgeon: Azalia Bilis, MD;  Location: [facility];  Service: Gynecology;  Laterality: N/A;  . LIPOSUCTION  04/2016  . NO PAST SURGERIES      reports that she has never smoked. She has never used smokeless tobacco. She reports that she drinks about 1.5 oz of alcohol per week. She reports that she does not use drugs. family history includes Breast cancer (age of onset: 83) in her sister; Cancer in her father; Heart disease in her father and mother; Hypertension in her father and mother. No Known Allergies   Review of Systems  Constitutional: Positive for fatigue. Negative for chills and fever.  HENT: Positive for congestion, sinus pressure, sinus pain and sore throat.   Respiratory: Negative for cough and shortness of breath.   Hematological:  Negative for adenopathy.       Objective:   Physical Exam  Constitutional: She appears well-developed and well-nourished.  HENT:  Right Ear: Tympanic membrane normal.  Left Ear: Tympanic membrane normal.  Mouth/Throat: Oropharynx is clear and moist. No oropharyngeal exudate.  Neck: Normal range of motion. Neck supple.  Cardiovascular: Normal rate and regular rhythm.  Pulmonary/Chest: Effort normal and breath sounds normal. She has no wheezes. She has no rales.       Assessment:     Probable acute sinusitis-partially treated with antibiotics as above    Plan:     -Complete full ten-day course of Augmentin 875 mg twice daily for 10 days -Plenty of fluids and rest -Touch base if symptoms not resolving in 7-10 days  Eulas Post MD [facility] at [facility]

## 2017-12-09 NOTE — Patient Instructions (Signed)

## 2017-12-11 ENCOUNTER — Other Ambulatory Visit: Payer: Self-pay | Admitting: Surgery

## 2017-12-11 DIAGNOSIS — R222 Localized swelling, mass and lump, trunk: Secondary | ICD-10-CM | POA: Diagnosis not present

## 2018-05-13 ENCOUNTER — Encounter: Payer: Self-pay | Admitting: Certified Nurse Midwife

## 2018-12-03 ENCOUNTER — Encounter: Payer: Self-pay | Admitting: Family Medicine

## 2018-12-03 DIAGNOSIS — H25013 Cortical age-related cataract, bilateral: Secondary | ICD-10-CM | POA: Diagnosis not present

## 2018-12-03 DIAGNOSIS — H0102A Squamous blepharitis right eye, upper and lower eyelids: Secondary | ICD-10-CM | POA: Diagnosis not present

## 2018-12-03 DIAGNOSIS — H35033 Hypertensive retinopathy, bilateral: Secondary | ICD-10-CM | POA: Diagnosis not present

## 2018-12-03 DIAGNOSIS — H2513 Age-related nuclear cataract, bilateral: Secondary | ICD-10-CM | POA: Diagnosis not present

## 2019-01-25 ENCOUNTER — Other Ambulatory Visit: Payer: Self-pay | Admitting: Surgery

## 2019-01-25 DIAGNOSIS — R222 Localized swelling, mass and lump, trunk: Secondary | ICD-10-CM | POA: Diagnosis not present

## 2019-01-28 DIAGNOSIS — M7751 Other enthesopathy of right foot: Secondary | ICD-10-CM | POA: Diagnosis not present

## 2019-01-28 DIAGNOSIS — S92911A Unspecified fracture of right toe(s), initial encounter for closed fracture: Secondary | ICD-10-CM | POA: Diagnosis not present

## 2019-01-28 DIAGNOSIS — S99921A Unspecified injury of right foot, initial encounter: Secondary | ICD-10-CM | POA: Diagnosis not present

## 2019-02-08 DIAGNOSIS — S99921D Unspecified injury of right foot, subsequent encounter: Secondary | ICD-10-CM | POA: Diagnosis not present

## 2019-02-18 DIAGNOSIS — S99921A Unspecified injury of right foot, initial encounter: Secondary | ICD-10-CM | POA: Diagnosis not present

## 2019-03-11 ENCOUNTER — Other Ambulatory Visit: Payer: Self-pay

## 2019-03-12 ENCOUNTER — Ambulatory Visit (INDEPENDENT_AMBULATORY_CARE_PROVIDER_SITE_OTHER): Payer: BC Managed Care – PPO | Admitting: Certified Nurse Midwife

## 2019-03-12 ENCOUNTER — Other Ambulatory Visit (HOSPITAL_COMMUNITY)
Admission: RE | Admit: 2019-03-12 | Discharge: 2019-03-12 | Disposition: A | Discharge status: Home / Self Care | Payer: BC Managed Care – PPO | Source: Ambulatory Visit | Attending: Certified Nurse Midwife | Admitting: Certified Nurse Midwife

## 2019-03-12 ENCOUNTER — Encounter: Payer: Self-pay | Admitting: Certified Nurse Midwife

## 2019-03-12 ENCOUNTER — Other Ambulatory Visit: Payer: Self-pay

## 2019-03-12 VITALS — BP 110/72 | HR 70 | Temp 97.1°F | Resp 16 | Ht 68.5 in | Wt 178.0 lb

## 2019-03-12 DIAGNOSIS — Z124 Encounter for screening for malignant neoplasm of cervix: Secondary | ICD-10-CM | POA: Diagnosis not present

## 2019-03-12 DIAGNOSIS — N952 Postmenopausal atrophic vaginitis: Secondary | ICD-10-CM | POA: Diagnosis not present

## 2019-03-12 DIAGNOSIS — Z23 Encounter for immunization: Secondary | ICD-10-CM | POA: Diagnosis not present

## 2019-03-12 DIAGNOSIS — E559 Vitamin D deficiency, unspecified: Secondary | ICD-10-CM | POA: Insufficient documentation

## 2019-03-12 DIAGNOSIS — Z1211 Encounter for screening for malignant neoplasm of colon: Secondary | ICD-10-CM

## 2019-03-12 DIAGNOSIS — R5383 Other fatigue: Secondary | ICD-10-CM | POA: Insufficient documentation

## 2019-03-12 DIAGNOSIS — Z01419 Encounter for gynecological examination (general) (routine) without abnormal findings: Secondary | ICD-10-CM | POA: Diagnosis not present

## 2019-03-12 NOTE — Patient Instructions (Signed)

## 2019-03-12 NOTE — Progress Notes (Signed)
58 y.o. G0P0000 Divorced  Caucasian Fe here for annual exam. Denies vaginal bleeding, but has noted increased discharge, occasional very light brown, not blood appearance. Having vaginal dryness and noted Sexual activity with ex husband, no STD concerns. Sees Dr. Elease Hashimoto for labs prn. Has RA and not had follow in a while and noticing some neck issues. Plans appointment and will have labs there. No other health concerns today.  Patient's last menstrual period was 02/04/2014.          Sexually active: Yes.    The current method of family planning is post menopausal status.    Exercising: Yes.    some walking Smoker:  no  Review of Systems  Constitutional: Negative.   HENT: Negative.   Eyes: Negative.   Respiratory: Negative.   Cardiovascular: Negative.   Gastrointestinal: Negative.   Genitourinary: Negative.   Musculoskeletal: Negative.   Skin: Negative.   Neurological: Negative.   Endo/Heme/Allergies: Negative.   Psychiatric/Behavioral: Negative.     Health Maintenance: Pap: 03-04-14 neg HPV HR neg, 09-25-16 neg History of Abnormal Pap: no MMG:  05-11-18  Category c density birads 1:neg Self Breast exams: yes Colonoscopy:  none BMD:   Around 2012 neg per patient TDaP:  Unsure if up to date Shingles: no Pneumonia: no Hep C and HIV: not done Labs: will decide   reports that she has never smoked. She has never used smokeless tobacco. She reports current alcohol use. She reports that she does not use drugs.  Past Medical History:  Diagnosis Date  . Abnormal uterine bleeding   . Fibroid   . Psoriatic arthritis [facility])     Past Surgical History:  Procedure Laterality Date  . DILATATION & CURETTAGE/HYSTEROSCOPY WITH TRUECLEAR N/A 04/15/2014   Procedure: Hysteroscopy, Attempted DILATATION & CURETTAGE,  with attempted TRUCLEAR;  Surgeon: Azalia Bilis, MD;  Location: [facility];  Service: Gynecology;  Laterality: N/A;  . LIPOSUCTION  04/2016  . NO PAST SURGERIES      Current  Outpatient Medications  Medication Sig Dispense Refill  . B Complex Vitamins (VITAMIN B COMPLEX PO) Take by mouth daily.    . Cholecalciferol (VITAMIN D) 2000 units CAPS Take 2,000 Units by mouth.    . Multiple Vitamins-Minerals (ZINC PO) Take by mouth.    . SELENIUM PO Take by mouth.     No current facility-administered medications for this visit.     Family History  Problem Relation Age of Onset  . Hypertension Mother   . Heart disease Mother   . Cancer Father        bladder  . Hypertension Father   . Heart disease Father   . Breast cancer Sister 54    ROS:  Pertinent items are noted in HPI.  Otherwise, a comprehensive ROS was negative.  Exam:   BP 110/72   Pulse 70   Temp (!) 97.1 F (36.2 C) (Skin)   Resp 16   Ht 5' 8.5" (1.74 m)   Wt 178 lb (80.7 kg)   LMP 02/04/2014   BMI 26.67 kg/m  Height: 5' 8.5" (174 cm) Ht Readings from Last 3 Encounters:  03/12/19 5' 8.5" (1.74 m)  09/25/16 5' 8.5" (1.74 m)  04/17/16 5' 8.5" (1.74 m)    General appearance: alert, cooperative and appears stated age Head: Normocephalic, without obvious abnormality, atraumatic Neck: no adenopathy, supple, symmetrical, trachea midline and thyroid normal to inspection and palpation Lungs: clear to auscultation bilaterally Breasts: normal appearance, no masses or tenderness, No  nipple retraction or dimpling, No nipple discharge or bleeding, No axillary or supraclavicular adenopathy Heart: regular rate and rhythm Abdomen: soft, non-tender; no masses,  no organomegaly Extremities: extremities normal, atraumatic, no cyanosis or edema Skin: Skin color, texture, turgor normal. No rashes or lesions Lymph nodes: Cervical, supraclavicular, and axillary nodes normal. No abnormal inguinal nodes palpated Neurologic: Grossly normal   Pelvic: External genitalia:  no lesions              Urethra:  normal appearing urethra with no masses, tenderness or lesions              Bartholin's and Skene's:  normal                 Vagina: atrophic appearing vagina with slight mustard  color  discharge, no lesions or vaginal odor              Cervix: no cervical motion tenderness, no lesions and normal appearance              Pap taken: Yes.   Bimanual Exam:  Uterus:  normal size, contour, position, consistency, mobility, non-tender and anteverted              Adnexa: normal adnexa and no mass, fullness, tenderness               Rectovaginal: Confirms               Anus:  normal sphincter tone, no lesions  Chaperone present: yes  A:  Well Woman with normal exam  Menopausal no HRT  Atrophic vaginitis  RA with no recent follow up plans to schedule  Colonoscopy due  Immunization due   P:   Reviewed health and wellness pertinent to exam  Aware of need to notify if vaginal bleeding.  Stressed follow up for best outcome long term.  Discussed vaginal finding and etiology.Quesitons addressed. Discussed Replens use OTC, does not want to use coconut trial or estrogen. Instructions given and will come back in 2 months to recheck or if having issues.  Discussed risks/benefits of colonoscopy, requests referral. Referral placed, she is aware she will be called with information regarding.  Requests TDAP  Pap smear: yes   counseled on breast self exam, mammography screening, STD prevention, HIV risk factors and prevention, feminine hygiene, adequate intake of calcium and vitamin D, diet and exercise  return annually or prn  An After Visit Summary was printed and given to the patient.

## 2019-03-15 LAB — CYTOLOGY - PAP
Diagnosis: NEGATIVE
HPV: NOT DETECTED

## 2019-03-18 ENCOUNTER — Other Ambulatory Visit (HOSPITAL_COMMUNITY)
Admission: RE | Admit: 2019-03-18 | Discharge: 2019-03-18 | Disposition: A | Discharge status: Home / Self Care | Payer: BC Managed Care – PPO | Source: Ambulatory Visit | Attending: Surgery | Admitting: Surgery

## 2019-03-18 DIAGNOSIS — Z01812 Encounter for preprocedural laboratory examination: Secondary | ICD-10-CM | POA: Insufficient documentation

## 2019-03-18 DIAGNOSIS — Z20828 Contact with and (suspected) exposure to other viral communicable diseases: Secondary | ICD-10-CM | POA: Insufficient documentation

## 2019-03-18 LAB — SARS CORONAVIRUS 2 (TAT 6-24 HRS): SARS Coronavirus 2: NEGATIVE

## 2019-03-22 ENCOUNTER — Other Ambulatory Visit: Payer: Self-pay | Admitting: Surgery

## 2019-03-22 DIAGNOSIS — D171 Benign lipomatous neoplasm of skin and subcutaneous tissue of trunk: Secondary | ICD-10-CM | POA: Diagnosis not present

## 2019-04-01 DIAGNOSIS — Z8371 Family history of colonic polyps: Secondary | ICD-10-CM | POA: Diagnosis not present

## 2019-04-01 DIAGNOSIS — Z1211 Encounter for screening for malignant neoplasm of colon: Secondary | ICD-10-CM | POA: Diagnosis not present

## 2019-05-17 ENCOUNTER — Encounter: Payer: Self-pay | Admitting: Certified Nurse Midwife

## 2019-05-17 DIAGNOSIS — Z1231 Encounter for screening mammogram for malignant neoplasm of breast: Secondary | ICD-10-CM | POA: Diagnosis not present

## 2019-05-17 DIAGNOSIS — Z803 Family history of malignant neoplasm of breast: Secondary | ICD-10-CM | POA: Diagnosis not present

## 2019-05-21 DIAGNOSIS — K635 Polyp of colon: Secondary | ICD-10-CM | POA: Diagnosis not present

## 2019-05-21 DIAGNOSIS — Z1211 Encounter for screening for malignant neoplasm of colon: Secondary | ICD-10-CM | POA: Diagnosis not present

## 2019-05-21 DIAGNOSIS — Z8371 Family history of colonic polyps: Secondary | ICD-10-CM | POA: Diagnosis not present

## 2019-05-21 DIAGNOSIS — D120 Benign neoplasm of cecum: Secondary | ICD-10-CM | POA: Diagnosis not present

## 2019-10-18 ENCOUNTER — Encounter: Payer: Self-pay | Admitting: Certified Nurse Midwife

## 2020-02-10 ENCOUNTER — Telehealth: Payer: Self-pay | Admitting: Family Medicine

## 2020-02-10 DIAGNOSIS — L4050 Arthropathic psoriasis, unspecified: Secondary | ICD-10-CM

## 2020-02-10 NOTE — Telephone Encounter (Signed)
Needs a new referral to Dr. Sheryn Bison [address] [place], [place] [address] [phone]  Needs an updated referral and most recent blood work and X-Rays within the last 3 years

## 2020-02-11 NOTE — Addendum Note (Signed)
Addended by: Modena Morrow R on: 02/11/2020 12:52 PM   Modules accepted: Orders

## 2020-02-11 NOTE — Telephone Encounter (Signed)
Referral has been placed. 

## 2020-02-11 NOTE — Telephone Encounter (Signed)
Please advise on referral for rheumatology

## 2020-02-11 NOTE — Telephone Encounter (Signed)
Ok to refer.  We have not done any labs on her in past 3 years.

## 2020-02-15 ENCOUNTER — Telehealth (INDEPENDENT_AMBULATORY_CARE_PROVIDER_SITE_OTHER): Payer: BC Managed Care – PPO | Admitting: Family Medicine

## 2020-02-15 ENCOUNTER — Telehealth: Payer: Self-pay | Admitting: Family Medicine

## 2020-02-15 DIAGNOSIS — L4050 Arthropathic psoriasis, unspecified: Secondary | ICD-10-CM

## 2020-02-15 NOTE — Telephone Encounter (Signed)
Pt scheduled for appt.

## 2020-02-15 NOTE — Telephone Encounter (Signed)
Pt stated she would like to be seen ASAP for Rheumatology. Spoke to [employer] (handeling referrals while Hilda Blades is gone) and she stated that she would have to be marked urgent for her to get in ASAP

## 2020-02-15 NOTE — Telephone Encounter (Signed)
We have not even seen her for her referral issue.  I think at this point we should offer office follow up to assess so we can document/determine urgency of referral.

## 2020-02-15 NOTE — Progress Notes (Signed)
Patient ID: SHAKIARA LUKIC, female   DOB: 10-12-1960, 59 y.o.   MRN: [id number]  This visit type was conducted due to national recommendations for restrictions regarding the COVID-19 pandemic in an effort to limit this patient's exposure and mitigate transmission in our community.   Virtual Visit via Video Note  I connected with Lewanna Petrak on 02/15/20 at  3:45 PM EDT by a video enabled telemedicine application and verified that I am speaking with the correct person using two identifiers.  Location patient: home Location provider:work or home office Persons participating in the virtual visit: patient, provider  I discussed the limitations of evaluation and management by telemedicine and the availability of in person appointments. The patient expressed understanding and agreed to proceed.   HPI:  Delailah had called last week requesting rheumatology referral.  She apparently was diagnosed around age 89 back when she lived in [place] with psoriatic arthritis.  She was prescribed Plaquenil but never took this.  She had involvement predominantly of one major finger at that time.  She then apparently saw someone with rheumatology group down in [place] around 2014 when they moved to [place].  She then saw Dr. Lenna Gilford here in [place] about 3 or 4 years ago.  Britta's reason for calling is that she recently applied for long-term care insurance.  There have been a comment on notes from previous gynecology visit that she needed to follow-up with rheumatologist and the insurance carrier raised issues that she had not seen a rheumatologist in some time.  She again does not take any anti-inflammatory medications.  She has not noted any new skin rashes.  No progression of any arthritic pains  ROS: See pertinent positives and negatives per HPI.  Past Medical History:  Diagnosis Date  . Abnormal uterine bleeding   . Fibroid   . Psoriatic arthritis [facility])     Past Surgical History:  Procedure  Laterality Date  . DILATATION & CURETTAGE/HYSTEROSCOPY WITH TRUECLEAR N/A 04/15/2014   Procedure: Hysteroscopy, Attempted DILATATION & CURETTAGE,  with attempted TRUCLEAR;  Surgeon: Azalia Bilis, MD;  Location: [facility];  Service: Gynecology;  Laterality: N/A;  . LIPOSUCTION  04/2016  . NO PAST SURGERIES      Family History  Problem Relation Age of Onset  . Hypertension Mother   . Heart disease Mother   . Cancer Father        bladder  . Hypertension Father   . Heart disease Father   . Breast cancer Sister 46    SOCIAL HX: Non-smoker   Current Outpatient Medications:  .  B Complex Vitamins (VITAMIN B COMPLEX PO), Take by mouth daily., Disp: , Rfl:  .  Cholecalciferol (VITAMIN D) 2000 units CAPS, Take 2,000 Units by mouth., Disp: , Rfl:  .  Multiple Vitamins-Minerals (ZINC PO), Take by mouth., Disp: , Rfl:  .  SELENIUM PO, Take by mouth., Disp: , Rfl:   EXAM:  VITALS per patient if applicable:  GENERAL: alert, oriented, appears well and in no acute distress  HEENT: atraumatic, conjunttiva clear, no obvious abnormalities on inspection of external nose and ears  NECK: normal movements of the head and neck  LUNGS: on inspection no signs of respiratory distress, breathing rate appears normal, no obvious gross SOB, gasping or wheezing  CV: no obvious cyanosis  MS: moves all visible extremities without noticeable abnormality  PSYCH/NEURO: pleasant and cooperative, no obvious depression or anxiety, speech and thought processing grossly intact  ASSESSMENT AND PLAN:  Discussed the  following assessment and plan:  Psoriatic arthritis ([place]) - Plan: Ambulatory referral to Rheumatology   Patient is relatively asymptomatic at this time.  She is requesting referral to rheumatologist because of difficulties getting coverage for long-term care insurance.  We will set up to see Dr. Lenna Gilford whom she has seen previously     I discussed the assessment and treatment plan with the patient.  The patient was provided an opportunity to ask questions and all were answered. The patient agreed with the plan and demonstrated an understanding of the instructions.   The patient was advised to call back or seek an in-person evaluation if the symptoms worsen or if the condition fails to improve as anticipated.     Carolann Littler, MD

## 2020-02-15 NOTE — Telephone Encounter (Signed)
Please advise 

## 2020-03-10 ENCOUNTER — Other Ambulatory Visit: Payer: Self-pay | Admitting: Physician Assistant

## 2020-03-10 DIAGNOSIS — M533 Sacrococcygeal disorders, not elsewhere classified: Secondary | ICD-10-CM | POA: Diagnosis not present

## 2020-03-10 DIAGNOSIS — M2550 Pain in unspecified joint: Secondary | ICD-10-CM | POA: Diagnosis not present

## 2020-03-10 DIAGNOSIS — M4120 Other idiopathic scoliosis, site unspecified: Secondary | ICD-10-CM | POA: Diagnosis not present

## 2020-03-10 DIAGNOSIS — M150 Primary generalized (osteo)arthritis: Secondary | ICD-10-CM | POA: Diagnosis not present

## 2020-03-29 DIAGNOSIS — D171 Benign lipomatous neoplasm of skin and subcutaneous tissue of trunk: Secondary | ICD-10-CM | POA: Diagnosis not present

## 2020-03-30 NOTE — Progress Notes (Deleted)
59 y.o. G0P0000 Divorced White or Caucasian Not Hispanic or Latino female here for annual exam.      Patient's last menstrual period was 02/04/2014.          Sexually active: {yes no:314532}  The current method of family planning is {contraception:315051}.    Exercising: {yes no:314532}  {types:19826} Smoker:  {YES P5382123  Health Maintenance: Pap: 09-25-16 neg, 03-04-14 neg HPV HR neg   History of abnormal Pap:  no MMG:  05/17/19 Bi-rads 1 neg  BMD:   *** Colonoscopy: *** TDaP:  03/12/19  Gardasil: none    reports that she has never smoked. She has never used smokeless tobacco. She reports current alcohol use. She reports that she does not use drugs.  Past Medical History:  Diagnosis Date  . Abnormal uterine bleeding   . Fibroid   . Psoriatic arthritis [facility])     Past Surgical History:  Procedure Laterality Date  . DILATATION & CURETTAGE/HYSTEROSCOPY WITH TRUECLEAR N/A 04/15/2014   Procedure: Hysteroscopy, Attempted DILATATION & CURETTAGE,  with attempted TRUCLEAR;  Surgeon: Azalia Bilis, MD;  Location: [facility];  Service: Gynecology;  Laterality: N/A;  . LIPOSUCTION  04/2016  . NO PAST SURGERIES      Current Outpatient Medications  Medication Sig Dispense Refill  . B Complex Vitamins (VITAMIN B COMPLEX PO) Take by mouth daily.    . Cholecalciferol (VITAMIN D) 2000 units CAPS Take 2,000 Units by mouth.    . Multiple Vitamins-Minerals (ZINC PO) Take by mouth.    . SELENIUM PO Take by mouth.     No current facility-administered medications for this visit.    Family History  Problem Relation Age of Onset  . Hypertension Mother   . Heart disease Mother   . Cancer Father        bladder  . Hypertension Father   . Heart disease Father   . Breast cancer Sister 68    Review of Systems  Exam:   LMP 02/04/2014   Weight change: @WEIGHTCHANGE @ Height:      Ht Readings from Last 3 Encounters:  03/12/19 5' 8.5" (1.74 m)  09/25/16 5' 8.5" (1.74 m)  04/17/16 5' 8.5" (1.74  m)    General appearance: alert, cooperative and appears stated age Head: Normocephalic, without obvious abnormality, atraumatic Neck: no adenopathy, supple, symmetrical, trachea midline and thyroid {CHL AMB PHY EX THYROID NORM DEFAULT:[phone]::"normal to inspection and palpation"} Lungs: clear to auscultation bilaterally Cardiovascular: regular rate and rhythm Breasts: {Exam; breast:13139::"normal appearance, no masses or tenderness"} Abdomen: soft, non-tender; non distended,  no masses,  no organomegaly Extremities: extremities normal, atraumatic, no cyanosis or edema Skin: Skin color, texture, turgor normal. No rashes or lesions Lymph nodes: Cervical, supraclavicular, and axillary nodes normal. No abnormal inguinal nodes palpated Neurologic: Grossly normal   Pelvic: External genitalia:  no lesions              Urethra:  normal appearing urethra with no masses, tenderness or lesions              Bartholins and Skenes: normal                 Vagina: normal appearing vagina with normal color and discharge, no lesions              Cervix: {CHL AMB PHY EX CERVIX NORM DEFAULT:[phone]::"no lesions"}               Bimanual Exam:  Uterus:  {CHL AMB PHY EX UTERUS  NORM [phone]::"normal size, contour, position, consistency, mobility, non-tender"}              Adnexa: {CHL AMB PHY EX ADNEXA NO MASS DEFAULT:[phone]::"no mass, fullness, tenderness"}               Rectovaginal: Confirms               Anus:  normal sphincter tone, no lesions  *** chaperoned for the exam.  A:  Well Woman with normal exam  P:

## 2020-04-06 ENCOUNTER — Ambulatory Visit: Payer: BC Managed Care – PPO | Admitting: Obstetrics and Gynecology

## 2020-04-10 ENCOUNTER — Other Ambulatory Visit: Payer: Self-pay | Admitting: Physician Assistant

## 2020-04-12 ENCOUNTER — Other Ambulatory Visit: Payer: BC Managed Care – PPO

## 2020-04-24 ENCOUNTER — Ambulatory Visit (INDEPENDENT_AMBULATORY_CARE_PROVIDER_SITE_OTHER): Payer: BC Managed Care – PPO | Admitting: Obstetrics and Gynecology

## 2020-04-24 ENCOUNTER — Other Ambulatory Visit: Payer: Self-pay

## 2020-04-24 ENCOUNTER — Encounter: Payer: Self-pay | Admitting: Obstetrics and Gynecology

## 2020-04-24 VITALS — BP 128/70 | HR 68 | Ht 68.75 in | Wt 176.0 lb

## 2020-04-24 DIAGNOSIS — M542 Cervicalgia: Secondary | ICD-10-CM | POA: Insufficient documentation

## 2020-04-24 DIAGNOSIS — G4700 Insomnia, unspecified: Secondary | ICD-10-CM | POA: Insufficient documentation

## 2020-04-24 DIAGNOSIS — M545 Low back pain, unspecified: Secondary | ICD-10-CM | POA: Insufficient documentation

## 2020-04-24 DIAGNOSIS — Z01419 Encounter for gynecological examination (general) (routine) without abnormal findings: Secondary | ICD-10-CM

## 2020-04-24 NOTE — Progress Notes (Signed)
59 y.o. G0P0000 Divorced White or Caucasian Not Hispanic or Latino female here for annual exam.  She is living with her ex. Sexually active, some dryness, some help with lubrication. No vaginal bleeding. No bowel or bladder c/o.     Doing well.  Patient's last menstrual period was 02/04/2014.          Sexually active: Yes.    The current method of family planning is post menopausal status.    Exercising: Yes.    walking Smoker:  no  Health Maintenance: Pap:   03/12/19 Neg:Neg HR HPV  09/25/16 Neg  03/04/14 Neg:Neg HR HPV History of abnormal Pap:  no MMG:  05/17/19 BIRADS 1 negative/density b BMD:   About 2012 and normal per patient Colonoscopy: 05/21/19 f/u 5 years TDaP:  03/12/19 Gardasil: n/a   reports that she has never smoked. She has never used smokeless tobacco. She reports current alcohol use. She reports that she does not use drugs. 0-3 drinks a week. Works in [employer].   Past Medical History:  Diagnosis Date  . Abnormal uterine bleeding   . Fibroid   . Psoriatic arthritis [facility])     Past Surgical History:  Procedure Laterality Date  . DILATATION & CURETTAGE/HYSTEROSCOPY WITH TRUECLEAR N/A 04/15/2014   Procedure: Hysteroscopy, Attempted DILATATION & CURETTAGE,  with attempted TRUCLEAR;  Surgeon: Azalia Bilis, MD;  Location: [facility];  Service: Gynecology;  Laterality: N/A;  . LIPOSUCTION  04/2016  . NO PAST SURGERIES      Current Outpatient Medications  Medication Sig Dispense Refill  . B Complex Vitamins (VITAMIN B COMPLEX PO) Take by mouth daily.    . Cholecalciferol (VITAMIN D) 2000 units CAPS Take 2,000 Units by mouth.    . Multiple Vitamins-Minerals (ZINC PO) Take 50 mg by mouth daily.    . SELENIUM PO Take by mouth.     No current facility-administered medications for this visit.    Family History  Problem Relation Age of Onset  . Hypertension Mother   . Heart disease Mother   . Cancer Father        bladder  . Hypertension Father   . Heart disease  Father   . Breast cancer Sister 36    Review of Systems  Constitutional: Negative.   HENT: Negative.   Eyes: Negative.   Respiratory: Negative.   Cardiovascular: Negative.   Gastrointestinal: Negative.   Endocrine: Negative.   Genitourinary: Negative.   Musculoskeletal: Negative.   Skin: Negative.   Allergic/Immunologic: Negative.   Neurological: Negative.   Hematological: Negative.   Psychiatric/Behavioral: Negative.     Exam:   BP 128/70 (BP Location: Right Arm, Patient Position: Sitting, Cuff Size: Normal)   Pulse 68   Ht 5' 8.75" (1.746 m)   Wt 176 lb (79.8 kg)   LMP 02/04/2014   SpO2 99%   BMI 26.18 kg/m   Weight change: @WEIGHTCHANGE @ Height:   Height: 5' 8.75" (174.6 cm)  Ht Readings from Last 3 Encounters:  04/24/20 5' 8.75" (1.746 m)  03/12/19 5' 8.5" (1.74 m)  09/25/16 5' 8.5" (1.74 m)    General appearance: alert, cooperative and appears stated age Head: Normocephalic, without obvious abnormality, atraumatic Neck: no adenopathy, supple, symmetrical, trachea midline and thyroid normal to inspection and palpation Lungs: clear to auscultation bilaterally Cardiovascular: regular rate and rhythm Breasts: normal appearance, no masses or tenderness Abdomen: soft, non-tender; non distended,  no masses,  no organomegaly Extremities: extremities normal, atraumatic, no cyanosis or edema Skin: Skin color,  texture, turgor normal. No rashes or lesions Lymph nodes: Cervical, supraclavicular, and axillary nodes normal. No abnormal inguinal nodes palpated Neurologic: Grossly normal   Pelvic: External genitalia:  no lesions              Urethra:  normal appearing urethra with no masses, tenderness or lesions              Bartholins and Skenes: normal                 Vagina: atrophic appearing vagina with normal color and discharge, no lesions              Cervix: no lesions               Bimanual Exam:  Uterus:  normal size, contour, position, consistency, mobility,  non-tender              Adnexa: no mass, fullness, tenderness               Rectovaginal: Confirms               Anus:  normal sphincter tone, no lesions  Marisa Sprinkles chaperoned for the exam.  A:  Well Woman with normal exam  PMP  P:   No pap this year  Mammogram next month  Discussed breast self exam  Discussed calcium and vit D intake  Colonoscopy UTD

## 2020-04-24 NOTE — Patient Instructions (Signed)

## 2020-05-16 ENCOUNTER — Other Ambulatory Visit: Payer: BC Managed Care – PPO

## 2020-05-22 ENCOUNTER — Encounter: Payer: Self-pay | Admitting: Obstetrics and Gynecology

## 2020-05-22 DIAGNOSIS — Z1231 Encounter for screening mammogram for malignant neoplasm of breast: Secondary | ICD-10-CM | POA: Diagnosis not present

## 2020-11-02 DIAGNOSIS — M4120 Other idiopathic scoliosis, site unspecified: Secondary | ICD-10-CM | POA: Diagnosis not present

## 2020-11-02 DIAGNOSIS — M533 Sacrococcygeal disorders, not elsewhere classified: Secondary | ICD-10-CM | POA: Diagnosis not present

## 2020-11-02 DIAGNOSIS — M2550 Pain in unspecified joint: Secondary | ICD-10-CM | POA: Diagnosis not present

## 2020-11-02 DIAGNOSIS — M150 Primary generalized (osteo)arthritis: Secondary | ICD-10-CM | POA: Diagnosis not present

## 2020-12-12 DIAGNOSIS — H35372 Puckering of macula, left eye: Secondary | ICD-10-CM | POA: Diagnosis not present

## 2020-12-12 DIAGNOSIS — H25013 Cortical age-related cataract, bilateral: Secondary | ICD-10-CM | POA: Diagnosis not present

## 2020-12-12 DIAGNOSIS — H2513 Age-related nuclear cataract, bilateral: Secondary | ICD-10-CM | POA: Diagnosis not present

## 2020-12-12 DIAGNOSIS — H35033 Hypertensive retinopathy, bilateral: Secondary | ICD-10-CM | POA: Diagnosis not present

## 2020-12-12 DIAGNOSIS — H5213 Myopia, bilateral: Secondary | ICD-10-CM | POA: Diagnosis not present

## 2021-05-08 NOTE — Progress Notes (Signed)
60 y.o. G0P0000 Divorced White or Caucasian Not Hispanic or Latino female here for annual exam.  No vaginal bleeding.     Patient's last menstrual period was 02/04/2014.          Sexually active: Yes.    The current method of family planning is post menopausal status.    Exercising: Yes.     Walking  Smoker:  no  Health Maintenance: Pap:   03/12/19 Neg:Neg HR HPV             09/25/16 Neg             03/04/14 Neg:Neg HR HPV History of abnormal Pap:  no MMG:  05/22/20 Bi-rads 1 neg  BMD:   About 2012 and normal per patient Colonoscopy: 05/21/19 f/u 5 years TDaP:  03/12/19  Gardasil: n/a   reports that she has never smoked. She has never used smokeless tobacco. She reports current alcohol use. She reports that she does not use drugs. Just occasional ETOH. Works in [employer].   Past Medical History:  Diagnosis Date   Abnormal uterine bleeding    Fibroid    Psoriatic arthritis [facility])     Past Surgical History:  Procedure Laterality Date   DILATATION & CURETTAGE/HYSTEROSCOPY WITH TRUECLEAR N/A 04/15/2014   Procedure: Hysteroscopy, Attempted DILATATION & CURETTAGE,  with attempted TRUCLEAR;  Surgeon: Azalia Bilis, MD;  Location: [facility];  Service: Gynecology;  Laterality: N/A;   LIPOSUCTION  04/2016   NO PAST SURGERIES      Current Outpatient Medications  Medication Sig Dispense Refill   B Complex Vitamins (VITAMIN B COMPLEX PO) Take by mouth daily.     Cholecalciferol (VITAMIN D) 2000 units CAPS Take 2,000 Units by mouth.     meloxicam (MOBIC) 15 MG tablet 1 tablet     Multiple Vitamins-Minerals (ZINC PO) Take 50 mg by mouth daily.     SELENIUM PO Take by mouth.     No current facility-administered medications for this visit.    Family History  Problem Relation Age of Onset   Hypertension Mother    Heart disease Mother    Cancer Father        bladder   Hypertension Father    Heart disease Father    Breast cancer Sister 80  Sister had negative genetic testing.   Review  of Systems  All other systems reviewed and are negative.  Exam:   BP 110/74 (BP Location: Right Arm, Patient Position: Sitting, Cuff Size: Normal)   Pulse 69   Ht 5\' 8"  (1.727 m)   Wt 176 lb (79.8 kg)   LMP 02/04/2014   SpO2 97%   BMI 26.76 kg/m   Weight change: @WEIGHTCHANGE @ Height:   Height: 5\' 8"  (172.7 cm)  Ht Readings from Last 3 Encounters:  05/09/21 5\' 8"  (1.727 m)  04/24/20 5' 8.75" (1.746 m)  03/12/19 5' 8.5" (1.74 m)    General appearance: alert, cooperative and appears stated age Head: Normocephalic, without obvious abnormality, atraumatic Neck: no adenopathy, supple, symmetrical, trachea midline and thyroid normal to inspection and palpation Lungs: clear to auscultation bilaterally Cardiovascular: regular rate and rhythm Breasts: normal appearance, no masses or tenderness Abdomen: soft, non-tender; non distended,  no masses,  no organomegaly Extremities: extremities normal, atraumatic, no cyanosis or edema Skin: Skin color, texture, turgor normal. No rashes or lesions Lymph nodes: Cervical, supraclavicular, and axillary nodes normal. No abnormal inguinal nodes palpated Neurologic: Grossly normal   Pelvic: External genitalia:  no lesions  Urethra:  normal appearing urethra with no masses, tenderness or lesions              Bartholins and Skenes: normal                 Vagina: atrophic appearing vagina with normal color and discharge, no lesions              Cervix: no lesions               Bimanual Exam:  Uterus:  normal size, contour, position, consistency, mobility, non-tender              Adnexa: no mass, fullness, tenderness               Rectovaginal: Confirms               Anus:  normal sphincter tone, no lesions  Gae Dry chaperoned for the exam.  1. Well woman exam Discussed breast self exam Discussed calcium and vit D intake Labs with Rheumatology Mammogram due this month Colonoscopy UTD

## 2021-05-09 ENCOUNTER — Encounter: Payer: Self-pay | Admitting: Obstetrics and Gynecology

## 2021-05-09 ENCOUNTER — Other Ambulatory Visit: Payer: Self-pay

## 2021-05-09 ENCOUNTER — Ambulatory Visit (INDEPENDENT_AMBULATORY_CARE_PROVIDER_SITE_OTHER): Payer: BC Managed Care – PPO | Admitting: Obstetrics and Gynecology

## 2021-05-09 VITALS — BP 110/74 | HR 69 | Ht 68.0 in | Wt 176.0 lb

## 2021-05-09 DIAGNOSIS — Z01419 Encounter for gynecological examination (general) (routine) without abnormal findings: Secondary | ICD-10-CM | POA: Diagnosis not present

## 2021-05-09 DIAGNOSIS — M4120 Other idiopathic scoliosis, site unspecified: Secondary | ICD-10-CM | POA: Insufficient documentation

## 2021-05-09 DIAGNOSIS — M150 Primary generalized (osteo)arthritis: Secondary | ICD-10-CM | POA: Insufficient documentation

## 2021-05-09 DIAGNOSIS — M2550 Pain in unspecified joint: Secondary | ICD-10-CM | POA: Insufficient documentation

## 2021-05-09 DIAGNOSIS — G8929 Other chronic pain: Secondary | ICD-10-CM | POA: Insufficient documentation

## 2021-05-09 NOTE — Patient Instructions (Addendum)

## 2021-05-28 DIAGNOSIS — Z1231 Encounter for screening mammogram for malignant neoplasm of breast: Secondary | ICD-10-CM | POA: Diagnosis not present

## 2021-05-28 LAB — HM MAMMOGRAPHY

## 2021-05-29 ENCOUNTER — Encounter: Payer: Self-pay | Admitting: Obstetrics and Gynecology

## 2021-05-31 ENCOUNTER — Encounter: Payer: Self-pay | Admitting: Family Medicine

## 2021-11-22 ENCOUNTER — Telehealth: Payer: Self-pay | Admitting: Family Medicine

## 2021-11-22 NOTE — Telephone Encounter (Signed)
Pt called stating that she is [place] and needed to come in to receive a B12 shot. She has never had one before and stated her friend told her she needed to get one. I let her know that Dr. Elease Hashimoto is not in the office today and it will be a possibility that she might not get a shot today and she asked if I can send a message to see if she can.  ? ?Please advise.  ?

## 2021-11-23 NOTE — Telephone Encounter (Signed)
I spoke with the pt and she stated she received injection yesterday for B-12 and would like for [place] to disregard message about injection.  ?

## 2021-11-30 DIAGNOSIS — D1801 Hemangioma of skin and subcutaneous tissue: Secondary | ICD-10-CM | POA: Diagnosis not present

## 2021-11-30 DIAGNOSIS — L814 Other melanin hyperpigmentation: Secondary | ICD-10-CM | POA: Diagnosis not present

## 2022-03-06 ENCOUNTER — Other Ambulatory Visit: Payer: Self-pay | Admitting: Family Medicine

## 2022-03-06 DIAGNOSIS — M818 Other osteoporosis without current pathological fracture: Secondary | ICD-10-CM

## 2022-03-26 ENCOUNTER — Ambulatory Visit
Admission: RE | Admit: 2022-03-26 | Discharge: 2022-03-26 | Disposition: A | Discharge status: Home / Self Care | Payer: BC Managed Care – PPO | Source: Ambulatory Visit | Attending: Family Medicine | Admitting: Family Medicine

## 2022-03-26 DIAGNOSIS — M8589 Other specified disorders of bone density and structure, multiple sites: Secondary | ICD-10-CM | POA: Diagnosis not present

## 2022-03-26 DIAGNOSIS — M818 Other osteoporosis without current pathological fracture: Secondary | ICD-10-CM

## 2022-03-26 DIAGNOSIS — Z780 Asymptomatic menopausal state: Secondary | ICD-10-CM | POA: Diagnosis not present

## 2022-05-15 ENCOUNTER — Ambulatory Visit: Payer: BC Managed Care – PPO | Admitting: Obstetrics and Gynecology

## 2022-06-03 DIAGNOSIS — Z1231 Encounter for screening mammogram for malignant neoplasm of breast: Secondary | ICD-10-CM | POA: Diagnosis not present

## 2022-06-05 ENCOUNTER — Encounter: Payer: Self-pay | Admitting: Obstetrics and Gynecology

## 2022-07-27 ENCOUNTER — Emergency Department (HOSPITAL_BASED_OUTPATIENT_CLINIC_OR_DEPARTMENT_OTHER)
Admission: EM | Admit: 2022-07-27 | Discharge: 2022-07-27 | Disposition: A | Discharge status: Home / Self Care | Payer: BC Managed Care – PPO | Attending: Emergency Medicine | Admitting: Emergency Medicine

## 2022-07-27 ENCOUNTER — Encounter (HOSPITAL_BASED_OUTPATIENT_CLINIC_OR_DEPARTMENT_OTHER): Payer: Self-pay

## 2022-07-27 ENCOUNTER — Other Ambulatory Visit (HOSPITAL_BASED_OUTPATIENT_CLINIC_OR_DEPARTMENT_OTHER): Payer: Self-pay

## 2022-07-27 DIAGNOSIS — U071 COVID-19: Secondary | ICD-10-CM | POA: Insufficient documentation

## 2022-07-27 DIAGNOSIS — R051 Acute cough: Secondary | ICD-10-CM

## 2022-07-27 DIAGNOSIS — R5383 Other fatigue: Secondary | ICD-10-CM | POA: Diagnosis not present

## 2022-07-27 DIAGNOSIS — R509 Fever, unspecified: Secondary | ICD-10-CM | POA: Diagnosis not present

## 2022-07-27 LAB — RESP PANEL BY RT-PCR (RSV, FLU A&B, COVID)  RVPGX2
Influenza A by PCR: NEGATIVE
Influenza B by PCR: NEGATIVE
Resp Syncytial Virus by PCR: NEGATIVE
SARS Coronavirus 2 by RT PCR: POSITIVE — AB

## 2022-07-27 MED ORDER — NIRMATRELVIR/RITONAVIR (PAXLOVID)TABLET
3.0000 | ORAL_TABLET | Freq: Two times a day (BID) | ORAL | 0 refills | Status: AC
  Filled 2022-07-27: qty 30, 5d supply, fill #0

## 2022-07-27 NOTE — ED Provider Notes (Signed)
[facility] EMERGENC[facility] DEPT Provider Note   CSN: [id number] Arrival date & time: 07/27/22  0759     History  Chief Complaint  Patient presents with   URI    Brittney Gallegos is a 61 y.o. female.  The history is provided by the patient, the spouse and medical records. No language interpreter was used.  URI Presenting symptoms: congestion, cough, fatigue, fever and rhinorrhea   Severity:  Moderate Onset quality:  Gradual Duration:  4 days Timing:  Constant Progression:  Waxing and waning Chronicity:  New Relieved by:  Nothing Worsened by:  Nothing Ineffective treatments:  None tried Associated symptoms: myalgias   Associated symptoms: no headaches, no neck pain and no wheezing   Risk factors: sick contacts        Home Medications Prior to Admission medications   Medication Sig Start Date End Date Taking? Authorizing Provider  B Complex Vitamins (VITAMIN B COMPLEX PO) Take by mouth daily.    [provider]  Cholecalciferol (VITAMIN D) 2000 units CAPS Take 2,000 Units by mouth.    [provider]  meloxicam (MOBIC) 15 MG tablet 1 tablet 11/02/20   [provider]  Multiple Vitamins-Minerals (ZINC PO) Take 50 mg by mouth daily.    [provider]  SELENIUM PO Take by mouth.    [provider]      Allergies    Patient has no known allergies.    Review of Systems   Review of Systems  Constitutional:  Positive for chills, fatigue and fever. Negative for appetite change.  HENT:  Positive for congestion and rhinorrhea.   Respiratory:  Positive for cough. Negative for chest tightness, shortness of breath and wheezing.   Cardiovascular:  Negative for chest pain and palpitations.  Gastrointestinal:  Negative for abdominal pain, constipation, diarrhea, nausea and vomiting.  Genitourinary:  Negative for dysuria.  Musculoskeletal:  Positive for myalgias. Negative for back pain, neck pain and neck stiffness.  Skin:   Negative for rash.  Neurological:  Negative for light-headedness and headaches.  Psychiatric/Behavioral:  Negative for agitation.   All other systems reviewed and are negative.   Physical Exam Updated Vital Signs BP 125/77 (BP Location: Right Arm)   Pulse 85   Temp 98.8 F (37.1 C)   Resp 18   LMP 02/04/2014   SpO2 97%  Physical Exam Vitals and nursing note reviewed.  Constitutional:      General: She is not in acute distress.    Appearance: She is well-developed. She is not ill-appearing, toxic-appearing or diaphoretic.  HENT:     Head: Normocephalic and atraumatic.     Nose: Congestion and rhinorrhea present.     Mouth/Throat:     Mouth: Mucous membranes are moist.     Pharynx: No oropharyngeal exudate or posterior oropharyngeal erythema.  Eyes:     Extraocular Movements: Extraocular movements intact.     Conjunctiva/sclera: Conjunctivae normal.     Pupils: Pupils are equal, round, and reactive to light.  Cardiovascular:     Rate and Rhythm: Normal rate and regular rhythm.     Heart sounds: No murmur heard. Pulmonary:     Effort: Pulmonary effort is normal. No respiratory distress.     Breath sounds: Normal breath sounds. No wheezing, rhonchi or rales.  Chest:     Chest wall: No tenderness.  Abdominal:     General: Abdomen is flat.     Palpations: Abdomen is soft.     Tenderness: There is  no abdominal tenderness. There is no guarding or rebound.  Musculoskeletal:        General: No swelling or tenderness.     Cervical back: Neck supple. No tenderness.     Right lower leg: No edema.     Left lower leg: No edema.  Skin:    General: Skin is warm and dry.     Capillary Refill: Capillary refill takes less than 2 seconds.     Findings: No erythema.  Neurological:     General: No focal deficit present.     Mental Status: She is alert.     Sensory: No sensory deficit.     Motor: No weakness.  Psychiatric:        Mood and Affect: Mood normal.     ED Results /  Procedures / Treatments   Labs (all labs ordered are listed, but only abnormal results are displayed) Labs Reviewed  RESP PANEL BY RT-PCR (RSV, FLU A&B, COVID)  RVPGX2 - Abnormal; Notable for the following components:      Result Value   SARS Coronavirus 2 by RT PCR POSITIVE (*)    All other components within normal limits    EKG None  Radiology No results found.  Procedures Procedures    Medications Ordered in ED Medications - No data to display  ED Course/ Medical Decision Making/ A&P                           Medical Decision Making   Brittney Gallegos is a 61 y.o. female with a past medical history significant for uterine fibroids, osteoarthritis, and psoriatic arthritis who presents with fevers, chills, cough, and URI symptoms for the last 4 days.  Patient reports that husband has had similar URI symptoms the week before and her mother stayed with him for Christmas and had a roommate that was having URI symptoms.  Patient reports he is never had COVID-19 in the past.  She reports some congestion and cough and is try Mucinex without significant relief.  She otherwise denies nausea, vomiting, constipation, diarrhea, or urinary changes.  Did not report significant headache or neck pain or neck stiffness but does have some mild myalgias and fatigue.  Otherwise patient wanted to get evaluated today due to the cough.  On my exam, lungs were clear with no wheezing or rhonchi or rales.  Chest was nontender.  Abdomen nontender.  Patient has some congestion and rhinorrhea but otherwise exam reassuring.  No focal neurologic deficits.  Patient well-appearing.  Vital signs reassuring.  Not hypoxic.  Patient had viral testing that confirmed COVID-19 infection.  Patient reports that she is never had any kidney or liver troubles.  Previous creatinine was normal.  We had a shared decision-making conversation and she would like to get prescription for Paxlovid and will discuss with her physician  if she should take it.  Otherwise she denies any nausea or vomiting so we will hold on nausea medication she will take over-the-counter medication to help with her fevers and chills.  As her breath sounds were clear and she is not having significant chest pain or shortness of breath she agrees to hold on chest x-ray at this time.  Patient otherwise well-appearing and agrees with plan of care.  Will discharge with prescription for Paxlovid and plans to follow-up with PCP.  She has questions or concerns and was discharged in good condition.  Final Clinical Impression(s) / ED Diagnoses Final diagnoses:  COVID-19  Acute cough  Fatigue, unspecified type    Rx / DC Orders ED Discharge Orders          Ordered    nirmatrelvir/ritonavir (PAXLOVID) 20 x 150 MG & 10 x '100MG'$  TABS  2 times daily        07/27/22 1019           Clinical Impression: 1. COVID-19   2. Acute cough   3. Fatigue, unspecified type     Disposition: Discharge  Condition: Good  I have discussed the results, Dx and Tx plan with the pt(& family if present). He/she/they expressed understanding and agree(s) with the plan. Discharge instructions discussed at great length. Strict return precautions discussed and pt &/or family have verbalized understanding of the instructions. No further questions at time of discharge.    New Prescriptions   NIRMATRELVIR/RITONAVIR (PAXLOVID) 20 X 150 MG & 10 X '100MG'$  TABS    Take 3 tablets by mouth 2 (two) times daily for 5 days. Patient GFR is normal last time it was checked in our system Take nirmatrelvir (150 mg) two tablets twice daily for 5 days and ritonavir (100 mg) one tablet twice daily for 5 days.    Follow Up: Eulas Post, MD [place] [address] [facility] [place] [address] [phone]        Kandi Brusseau, Gwenyth Allegra,  MD 07/27/22 1020

## 2022-07-27 NOTE — ED Triage Notes (Signed)
She c/o cough/aches x 5-6 days. She states "I have auto-immune issues and I think I need an antibiotic". She is ambulatory and in no distress.

## 2022-07-27 NOTE — Discharge Instructions (Signed)
Your history, exam, workup today confirmed COVID-19 as the cause of your upper respiratory symptoms and fatigue.  As your breath sounds were clear and exam was reassuring we agreed to hold on more extensive workup and imaging at this time.  Please use  Motrin and Tylenol to help with fevers and chills and make sure you are resting and staying hydrated.  Please consider filling the prescription for Paxlovid as we discussed.  Please help with your primary doctor.  If any symptoms change or worsen acutely, please return to the nearest emergency department.

## 2022-07-27 NOTE — ED Notes (Signed)
Dc instructions reviewed with patient. Patient voiced understanding. Dc with belongings.  °

## 2022-08-26 NOTE — Progress Notes (Signed)
62 y.o. G0P0000 Divorced White or Caucasian Not Hispanic or Latino female here for annual exam.  She has no sex drive. She is with her same long term partner. Able to enjoy sex, able to orgasm. She recently had blood work with functional medicine and will send her levels over.   No vaginal bleeding. No bowel or bladder c/o.   H/O psoriatic arthritis. Controlling her diet and exercising helps. She wants to avoid medication. + marker for RA.   Patient's last menstrual period was 02/04/2014.          Sexually active: Yes.    The current method of family planning is post menopausal status.    Exercising: Yes.     Walking  Smoker:  no  Health Maintenance: Pap:  03/12/19 Neg:Neg HR HPV             09/25/16 Neg             03/04/14 Neg:Neg HR HPV History of abnormal Pap:  no MMG:  06/03/22 Bi-rads 1 neg  BMD:   03/27/22 Osteopenia, T score -1.7. FRAX 8.9/0.9% Colonoscopy: 05/21/2019 f/u/10  TDaP:  03/12/2019 Gardasil: n/a   reports that she has never smoked. She has never used smokeless tobacco. She reports current alcohol use. She reports that she does not use drugs. Works in Press [employer].    Past Medical History:  Diagnosis Date   Abnormal uterine bleeding    Fibroid    Psoriatic arthritis [facility])     Past Surgical History:  Procedure Laterality Date   DILATATION & CURETTAGE/HYSTEROSCOPY WITH TRUECLEAR N/A 04/15/2014   Procedure: Hysteroscopy, Attempted DILATATION & CURETTAGE,  with attempted TRUCLEAR;  Surgeon: Azalia Bilis, MD;  Location: [facility];  Service: Gynecology;  Laterality: N/A;   LIPOSUCTION  04/2016   NO PAST SURGERIES      Current Outpatient Medications  Medication Sig Dispense Refill   B Complex Vitamins (VITAMIN B COMPLEX PO) Take by mouth daily.     Cholecalciferol (VITAMIN D) 2000 units CAPS Take 2,000 Units by mouth.     Multiple Vitamins-Minerals (ZINC PO) Take 50 mg by mouth daily.     SELENIUM PO Take by mouth.     No current facility-administered medications for  this visit.    Family History  Problem Relation Age of Onset   Hypertension Mother    Heart disease Mother    Cancer Father        bladder   Hypertension Father    Heart disease Father    Breast cancer Sister 74    Review of Systems  All other systems reviewed and are negative.   Exam:   BP 130/80   Pulse 86   Ht 5' 8.11" (1.73 m)   Wt 178 lb (80.7 kg)   LMP 02/04/2014   SpO2 100%   BMI 26.98 kg/m   Weight change: '@WEIGHTCHANGE'$ @ Height:   Height: 5' 8.11" (173 cm)  Ht Readings from Last 3 Encounters:  09/05/22 5' 8.11" (1.73 m)  05/09/21 '5\' 8"'$  (1.727 m)  04/24/20 5' 8.75" (1.746 m)    General appearance: alert, cooperative and appears stated age Head: Normocephalic, without obvious abnormality, atraumatic Neck: no adenopathy, supple, symmetrical, trachea midline and thyroid normal to inspection and palpation Lungs: clear to auscultation bilaterally Cardiovascular: regular rate and rhythm Breasts: normal appearance, no masses or tenderness Abdomen: soft, non-tender; non distended,  no masses,  no organomegaly Extremities: extremities normal, atraumatic, no cyanosis or edema Skin: Skin color, texture, turgor  normal. No rashes or lesions Lymph nodes: Cervical, supraclavicular, and axillary nodes normal. No abnormal inguinal nodes palpated Neurologic: Grossly normal   Pelvic: External genitalia:  no lesions, vitiligo covering the vulva              Urethra:  normal appearing urethra with no masses, tenderness or lesions              Bartholins and Skenes: normal                 Vagina: normal appearing vagina with normal color and discharge, no lesions              Cervix: no lesions               Bimanual Exam:  Uterus:   no masses or tenderness              Adnexa: no mass, fullness, tenderness               Rectovaginal: Confirms               Anus:  normal sphincter tone, no lesions  Gae Dry, CMA chaperoned for the exam.  1. Well woman exam Discussed  breast self exam Discussed calcium and vit D intake Pap due next year Labs UTD Mammogram and colonoscopy UTD  2. History of osteopenia Discussed calcium/vit d intake, exercise and avoiding falls  3. Low libido Information given She will send a copy of her testosterone levels We discussed the option of off label testosterone cream. Reviewed the risks of getting too much testosterone. If her levels are low, will call in the compounded cream for her and have her get f/u blood work in one month.  Given samples of uberlube.

## 2022-09-05 ENCOUNTER — Encounter: Payer: Self-pay | Admitting: Obstetrics and Gynecology

## 2022-09-05 ENCOUNTER — Ambulatory Visit (INDEPENDENT_AMBULATORY_CARE_PROVIDER_SITE_OTHER): Payer: BC Managed Care – PPO | Admitting: Obstetrics and Gynecology

## 2022-09-05 VITALS — BP 130/80 | HR 86 | Ht 68.11 in | Wt 178.0 lb

## 2022-09-05 DIAGNOSIS — R6882 Decreased libido: Secondary | ICD-10-CM

## 2022-09-05 DIAGNOSIS — Z8739 Personal history of other diseases of the musculoskeletal system and connective tissue: Secondary | ICD-10-CM

## 2022-09-05 DIAGNOSIS — Z01419 Encounter for gynecological examination (general) (routine) without abnormal findings: Secondary | ICD-10-CM

## 2022-09-05 DIAGNOSIS — M545 Low back pain, unspecified: Secondary | ICD-10-CM | POA: Insufficient documentation

## 2022-09-05 NOTE — Patient Instructions (Signed)

## 2022-12-05 DIAGNOSIS — L538 Other specified erythematous conditions: Secondary | ICD-10-CM | POA: Diagnosis not present

## 2022-12-05 DIAGNOSIS — D225 Melanocytic nevi of trunk: Secondary | ICD-10-CM | POA: Diagnosis not present

## 2022-12-05 DIAGNOSIS — L821 Other seborrheic keratosis: Secondary | ICD-10-CM | POA: Diagnosis not present

## 2022-12-05 DIAGNOSIS — Z789 Other specified health status: Secondary | ICD-10-CM | POA: Diagnosis not present

## 2022-12-05 DIAGNOSIS — L298 Other pruritus: Secondary | ICD-10-CM | POA: Diagnosis not present

## 2022-12-05 DIAGNOSIS — L814 Other melanin hyperpigmentation: Secondary | ICD-10-CM | POA: Diagnosis not present

## 2022-12-05 DIAGNOSIS — L820 Inflamed seborrheic keratosis: Secondary | ICD-10-CM | POA: Diagnosis not present

## 2023-01-21 DIAGNOSIS — F4321 Adjustment disorder with depressed mood: Secondary | ICD-10-CM | POA: Diagnosis not present

## 2023-06-09 DIAGNOSIS — Z1231 Encounter for screening mammogram for malignant neoplasm of breast: Secondary | ICD-10-CM | POA: Diagnosis not present

## 2023-06-09 LAB — HM MAMMOGRAPHY

## 2023-06-11 ENCOUNTER — Encounter: Payer: Self-pay | Admitting: Family Medicine

## 2023-12-08 DIAGNOSIS — L821 Other seborrheic keratosis: Secondary | ICD-10-CM | POA: Diagnosis not present

## 2023-12-08 DIAGNOSIS — L820 Inflamed seborrheic keratosis: Secondary | ICD-10-CM | POA: Diagnosis not present

## 2023-12-08 DIAGNOSIS — D225 Melanocytic nevi of trunk: Secondary | ICD-10-CM | POA: Diagnosis not present

## 2023-12-08 DIAGNOSIS — L538 Other specified erythematous conditions: Secondary | ICD-10-CM | POA: Diagnosis not present

## 2023-12-08 DIAGNOSIS — L814 Other melanin hyperpigmentation: Secondary | ICD-10-CM | POA: Diagnosis not present

## 2023-12-08 DIAGNOSIS — R208 Other disturbances of skin sensation: Secondary | ICD-10-CM | POA: Diagnosis not present

## 2023-12-08 DIAGNOSIS — Z789 Other specified health status: Secondary | ICD-10-CM | POA: Diagnosis not present

## 2023-12-09 DIAGNOSIS — L4050 Arthropathic psoriasis, unspecified: Secondary | ICD-10-CM | POA: Diagnosis not present

## 2023-12-09 DIAGNOSIS — F5102 Adjustment insomnia: Secondary | ICD-10-CM | POA: Diagnosis not present

## 2023-12-09 DIAGNOSIS — R5382 Chronic fatigue, unspecified: Secondary | ICD-10-CM | POA: Diagnosis not present

## 2024-01-01 DIAGNOSIS — R21 Rash and other nonspecific skin eruption: Secondary | ICD-10-CM | POA: Diagnosis not present

## 2024-01-01 DIAGNOSIS — D849 Immunodeficiency, unspecified: Secondary | ICD-10-CM | POA: Diagnosis not present

## 2024-01-03 ENCOUNTER — Ambulatory Visit
Admission: EM | Admit: 2024-01-03 | Discharge: 2024-01-03 | Disposition: A | Discharge status: Home / Self Care | Attending: Family Medicine | Admitting: Family Medicine

## 2024-01-03 ENCOUNTER — Other Ambulatory Visit: Payer: Self-pay

## 2024-01-03 DIAGNOSIS — T7840XA Allergy, unspecified, initial encounter: Secondary | ICD-10-CM | POA: Diagnosis not present

## 2024-01-03 DIAGNOSIS — Z8619 Personal history of other infectious and parasitic diseases: Secondary | ICD-10-CM

## 2024-01-03 DIAGNOSIS — W57XXXA Bitten or stung by nonvenomous insect and other nonvenomous arthropods, initial encounter: Secondary | ICD-10-CM

## 2024-01-03 HISTORY — DX: Rheumatoid arthritis, unspecified: M06.9

## 2024-01-03 HISTORY — DX: Vitiligo: L80

## 2024-01-03 MED ORDER — IVERMECTIN 3 MG PO TABS: 200.0000 ug/kg | ORAL_TABLET | Freq: Once | ORAL | 0 refills | Status: DC

## 2024-01-03 MED ORDER — IVERMECTIN 3 MG PO TABS: ORAL_TABLET | ORAL | 0 refills | Status: DC

## 2024-01-03 MED ORDER — IVERMECTIN 3 MG PO TABS: ORAL_TABLET | ORAL | 0 refills | Status: AC

## 2024-01-03 NOTE — ED Provider Notes (Signed)
 [facility]    CSN: [id number] Arrival date & time: 01/03/24  1033      History   Chief Complaint Chief Complaint  Patient presents with   Allergic Reaction    HPI Brittney Gallegos is a 63 y.o. female.   HPI  Brittney Gallegos is here for a rash.  She has a number of red splotches around her abdomen that are moderately itchy.  She also has them on her arms and few on the legs,  scattered.  She thinks that they are likely insect bites.  At first she thought they were chiggers but they are spreading instead of resolving.  She states that they appeared after she had done yard work She has a history of having difficulty with rashes before.  She states that she was exposed to a family member with scabies and ended up with a scabies rash that took 2 years to go away.  She states not respond to any of the topical treatments and eventually had to take ivermectin .  With this rash concentrated around her waistline, she is concerned that it could be something like scabies.  She states that she has made a concerted effort to try to figure out how she could have been exposed to scabies, but states nothing has changed at her work or home When she was having difficulty with her prior rash she states that one of the providers suggested she might have delusions of parasitosis and wanted her to see a psychiatrist.  She states this was never necessary and the rash went away with ivermectin   Past Medical History:  Diagnosis Date   Abnormal uterine bleeding    Fibroid    Psoriatic arthritis (HCC)    RA (rheumatoid arthritis) (HCC)    Vitiligo     Patient Active Problem List   Diagnosis Date Noted   Lumbar pain 09/05/2022   Chronic pain 05/09/2021   Joint pain 05/09/2021   Other idiopathic scoliosis, site unspecified 05/09/2021   Primary generalized (osteo)arthritis 05/09/2021   Insomnia 04/24/2020   Low back pain 04/24/2020   Neck pain 04/24/2020   Vitamin D deficiency disease 03/12/2019    Fatigue 03/12/2019   Osteoarthritis of left glenohumeral joint 11/17/2017   Thrombocytopenia (HCC) 10/21/2014   Drug-induced leukopenia (HCC) 10/21/2014   Post-operative state 04/15/2014   Psoriatic arthritis (HCC) 11/26/2012   History of uterine fibroid 11/26/2012   Postmenopausal 11/26/2012   Polyarthritis 06/08/2012    Past Surgical History:  Procedure Laterality Date   DILATATION & CURETTAGE/HYSTEROSCOPY WITH TRUECLEAR N/A 04/15/2014   Procedure: Hysteroscopy, Attempted DILATATION & CURETTAGE,  with attempted TRUCLEAR;  Surgeon: Laurent Pontes, MD;  Location: [facility];  Service: Gynecology;  Laterality: N/A;   LIPOSUCTION  04/2016   NO PAST SURGERIES      OB History     Gravida  0   Para  0   Term  0   Preterm  0   AB  0   Living  0      SAB  0   IAB  0   Ectopic  0   Multiple  0   Live Births               Home Medications    Prior to Admission medications   Medication Sig Start Date End Date Taking? Authorizing Provider  ALPRAZolam (XANAX) 0.5 MG tablet Take 0.5 mg by mouth at bedtime as needed for anxiety.   Yes [provider]  amoxicillin  (AMOXIL ) 500 MG tablet Take 500 mg by mouth 2 (two) times daily.   Yes [provider]  methylPREDNISolone (MEDROL) 4 MG tablet Take 4 mg by mouth daily.   Yes [provider]  QUEtiapine (SEROQUEL) 50 MG tablet Take 50 mg by mouth at bedtime.   Yes [provider]  B Complex Vitamins (VITAMIN B COMPLEX PO) Take by mouth daily.    [provider]  Cholecalciferol (VITAMIN D) 2000 units CAPS Take 2,000 Units by mouth.    [provider]  ivermectin  (STROMECTOL ) 3 MG TABS tablet Take 5 tablets on day 1(today), 2,8,9 and 15 01/03/24   Stephany Ehrich, MD  Multiple Vitamins-Minerals (ZINC PO) Take 50 mg by mouth daily.    [provider]  SELENIUM PO Take by mouth.    [provider]    Family History Family History  Problem Relation Age of  Onset   Hypertension Mother    Heart disease Mother    Cancer Father        bladder   Hypertension Father    Heart disease Father    Breast cancer Sister 37    Social History Social History   Tobacco Use   Smoking status: Never   Smokeless tobacco: Never  Vaping Use   Vaping status: Never Used  Substance Use Topics   Alcohol use: Yes    Alcohol/week: 0.0 - 3.0 standard drinks of alcohol   Drug use: No     Allergies   Patient has no known allergies.   Review of Systems Review of Systems See HPI  Physical Exam Triage Vital Signs ED Triage Vitals  Encounter Vitals Group     BP 01/03/24 1037 (!) 149/88     Systolic BP Percentile --      Diastolic BP Percentile --      Pulse Rate 01/03/24 1037 70     Resp 01/03/24 1037 16     Temp 01/03/24 1037 98.1 F (36.7 C)     Temp src --      SpO2 01/03/24 1037 97 %     Weight --      Height --      Head Circumference --      Peak Flow --      Pain Score 01/03/24 1041 0     Pain Loc --      Pain Education --      Exclude from Growth Chart --    No data found.  Updated Vital Signs BP (!) 149/88   Pulse 70   Temp 98.1 F (36.7 C)   Resp 16   LMP 02/04/2014   SpO2 97%       Physical Exam Constitutional:      General: She is not in acute distress.    Appearance: She is well-developed.  HENT:     Head: Normocephalic and atraumatic.  Eyes:     Conjunctiva/sclera: Conjunctivae normal.     Pupils: Pupils are equal, round, and reactive to light.  Cardiovascular:     Rate and Rhythm: Normal rate.  Pulmonary:     Effort: Pulmonary effort is normal. No respiratory distress.  Musculoskeletal:        General: Normal range of motion.     Cervical back: Normal range of motion.  Skin:    General: Skin is warm and dry.     Findings: Rash present.     Comments: There are scattered lesions on both arms.  The rash is concentrated around the abdomen and down into the groin.  They are an angry red irregular appearance  measuring from 2 to 4 cm across.  Some appear to have satellite lesions.  One of them does have some scale.  A few are excoriated.  They do not blanch.  Slightly raised.  Neurological:     Mental Status: She is alert.      UC Treatments / Results  Labs (all labs ordered are listed, but only abnormal results are displayed) Labs Reviewed - No data to display  EKG   Radiology No results found.  Procedures Procedures (including critical care time)  Medications Ordered in UC Medications - No data to display  Initial Impression / Assessment and Plan / UC Course  I have reviewed the triage vital signs and the nursing notes.  Pertinent labs & imaging results that were available during my care of the patient were reviewed by me and considered in my medical decision making (see chart for details).    Patient has a history of having allergic reactions to insect bites with large angry lesions.  She states even mosquitoes will do this.  She also has a history of a difficult to clear  rash that was diagnosed by a dermatologist as scabies.  Patient does have some immune compromise given the medications that she uses.  She made a video visit and was given amoxicillin  and Medrol.  She has taken 3 days of this with no improvement.  Patient has a lot of anxiety regarding this advancing rash because of her history of difficult rashes in the past.  She is requesting ivermectin .  She states she tolerated it well in the past.  I will give her a single course of this, have looked up current dosing recommendations and up-to-date.  She needs follow-up with her doctor and possibly dermatology if this fails to improve  Final Clinical Impressions(s) / UC Diagnoses   Final diagnoses:  Allergic reaction, initial encounter  Insect bites and stings, initial encounter  History of scabies   Discharge Instructions      Take ivermectin  as directed.  The medical recommendation for scabies is to take the pills on  days 1 and 2, days 8 and 9, and 15.  This is the most effective to get rid of the mites based on their lifecycle May continue cortisone cream May take a nondrowsy antihistamine like Zyrtec or Claritin in the morning.  Take a double dose May take Benadryl at bedtime.  The antihistamines are for itching and help with allergic reaction Finish the Medrol Dosepak Stop the amoxicillin  See your doctor if not improving by next week  ED Prescriptions     Medication Sig Dispense Auth. Provider   ivermectin  (STROMECTOL ) 3 MG TABS tablet Take 5 tablets on day 1(today), 2,8,9 and 15 25 tablet Stephany Ehrich, MD      PDMP not reviewed this encounter.   Stephany Ehrich, MD 01/03/24 [phone]

## 2024-01-03 NOTE — ED Triage Notes (Addendum)
 Allergic reaction, rash all over body. Started after cutting down bushes, has been since last weekend. Initially thought she had chiggers. Feels like something is biting her. Has had televisit was prescribed abx and steroids (this is 3rd day and states has not helped). Amoxicillin  and methylprednisolone. Has been using hydrocortisone cream. Took one of husband's xanax this morning d/t anxiety.

## 2024-01-03 NOTE — Discharge Instructions (Signed)
 Take ivermectin  as directed.  The medical recommendation for scabies is to take the pills on days 1 and 2, days 8 and 9, and 15.  This is the most effective to get rid of the mites based on their lifecycle May continue cortisone cream May take a nondrowsy antihistamine like Zyrtec or Claritin in the morning.  Take a double dose May take Benadryl at bedtime.  The antihistamines are for itching and help with allergic reaction Finish the Medrol Dosepak Stop the amoxicillin  See your doctor if not improving by next week

## 2024-01-04 ENCOUNTER — Telehealth: Payer: Self-pay | Admitting: Family Medicine

## 2024-01-04 ENCOUNTER — Telehealth: Payer: Self-pay

## 2024-01-04 MED ORDER — PERMETHRIN 5 % EX CREA: TOPICAL_CREAM | CUTANEOUS | 0 refills | Status: DC

## 2024-01-04 NOTE — Telephone Encounter (Signed)
 Pt wanting permethrin  cream, dr. Nicholette Barley notified, sts she will send

## 2024-01-04 NOTE — Telephone Encounter (Signed)
 Patient calls requesting cream to be used in addition to the ivermectin  tablets

## 2024-01-05 ENCOUNTER — Telehealth: Payer: Self-pay

## 2024-01-05 MED ORDER — PERMETHRIN 5 % EX CREA: TOPICAL_CREAM | CUTANEOUS | 0 refills | Status: AC

## 2024-01-19 ENCOUNTER — Telehealth: Payer: Self-pay

## 2024-01-22 NOTE — Telephone Encounter (Signed)
 Informed patient Dr. Nelson sts will not refill cream, will have to have pt f/u with pcp.

## 2024-06-14 DIAGNOSIS — Z1231 Encounter for screening mammogram for malignant neoplasm of breast: Secondary | ICD-10-CM | POA: Diagnosis not present

## 2024-06-14 LAB — HM MAMMOGRAPHY

## 2024-06-15 ENCOUNTER — Ambulatory Visit: Payer: Self-pay | Admitting: Obstetrics and Gynecology

## 2024-06-15 ENCOUNTER — Encounter: Payer: Self-pay | Admitting: Obstetrics and Gynecology

## 2024-06-23 ENCOUNTER — Encounter: Payer: Self-pay | Admitting: Obstetrics and Gynecology

## 2024-06-23 DIAGNOSIS — R928 Other abnormal and inconclusive findings on diagnostic imaging of breast: Secondary | ICD-10-CM | POA: Diagnosis not present

## 2024-06-23 LAB — HM MAMMOGRAPHY

## 2024-06-24 ENCOUNTER — Ambulatory Visit: Payer: Self-pay | Admitting: Obstetrics and Gynecology
